# Patient Record
Sex: Female | Born: 1995 | Race: White | Hispanic: No | Marital: Single | Smoking: Never smoker
Health system: Southern US, Community
[De-identification: ages and names within clinical notes are randomized; demographics above are authoritative.]

## PROBLEM LIST (undated history)

## (undated) DIAGNOSIS — I10 Essential (primary) hypertension: Secondary | ICD-10-CM

## (undated) DIAGNOSIS — J45909 Unspecified asthma, uncomplicated: Secondary | ICD-10-CM

## (undated) DIAGNOSIS — J324 Chronic pansinusitis: Secondary | ICD-10-CM

## (undated) DIAGNOSIS — D6800 Von Willebrand disease, unspecified: Secondary | ICD-10-CM

## (undated) HISTORY — PX: WISDOM TOOTH EXTRACTION: SHX21

---

## 2015-09-24 NOTE — Progress Notes (Signed)
No show

## 2015-09-26 ENCOUNTER — Ambulatory Visit (HOSPITAL_COMMUNITY): Payer: Self-pay | Admitting: Oncology

## 2015-10-02 NOTE — Progress Notes (Signed)
NO SHOW

## 2015-10-03 ENCOUNTER — Ambulatory Visit (HOSPITAL_COMMUNITY): Payer: Self-pay | Admitting: Oncology

## 2015-10-03 ENCOUNTER — Encounter (HOSPITAL_COMMUNITY): Payer: Self-pay | Admitting: Oncology

## 2015-11-30 ENCOUNTER — Other Ambulatory Visit (INDEPENDENT_AMBULATORY_CARE_PROVIDER_SITE_OTHER): Payer: Self-pay | Admitting: Otolaryngology

## 2015-11-30 ENCOUNTER — Ambulatory Visit (INDEPENDENT_AMBULATORY_CARE_PROVIDER_SITE_OTHER): Payer: Medicaid Other | Admitting: Otolaryngology

## 2015-11-30 DIAGNOSIS — J310 Chronic rhinitis: Secondary | ICD-10-CM

## 2015-11-30 DIAGNOSIS — J0101 Acute recurrent maxillary sinusitis: Secondary | ICD-10-CM

## 2015-11-30 DIAGNOSIS — J330 Polyp of nasal cavity: Secondary | ICD-10-CM | POA: Diagnosis not present

## 2015-11-30 DIAGNOSIS — J329 Chronic sinusitis, unspecified: Secondary | ICD-10-CM

## 2015-12-06 ENCOUNTER — Ambulatory Visit (HOSPITAL_COMMUNITY)
Admission: RE | Admit: 2015-12-06 | Discharge: 2015-12-06 | Disposition: A | Discharge status: Home / Self Care | Payer: Medicaid Other | Source: Ambulatory Visit | Attending: Otolaryngology | Admitting: Otolaryngology

## 2015-12-06 DIAGNOSIS — J329 Chronic sinusitis, unspecified: Secondary | ICD-10-CM | POA: Diagnosis not present

## 2015-12-14 ENCOUNTER — Ambulatory Visit (INDEPENDENT_AMBULATORY_CARE_PROVIDER_SITE_OTHER): Payer: Medicaid Other | Admitting: Otolaryngology

## 2015-12-14 DIAGNOSIS — J310 Chronic rhinitis: Secondary | ICD-10-CM

## 2015-12-14 DIAGNOSIS — J320 Chronic maxillary sinusitis: Secondary | ICD-10-CM

## 2016-02-15 ENCOUNTER — Ambulatory Visit (INDEPENDENT_AMBULATORY_CARE_PROVIDER_SITE_OTHER): Payer: Medicaid Other | Admitting: Otolaryngology

## 2016-04-18 ENCOUNTER — Ambulatory Visit (INDEPENDENT_AMBULATORY_CARE_PROVIDER_SITE_OTHER): Payer: Medicaid Other | Admitting: Otolaryngology

## 2016-07-29 ENCOUNTER — Encounter (HOSPITAL_COMMUNITY): Payer: Self-pay | Admitting: Emergency Medicine

## 2016-07-29 ENCOUNTER — Emergency Department (HOSPITAL_COMMUNITY): Payer: Medicaid Other

## 2016-07-29 ENCOUNTER — Emergency Department (HOSPITAL_COMMUNITY)
Admission: EM | Admit: 2016-07-29 | Discharge: 2016-07-29 | Disposition: A | Discharge status: Home / Self Care | Payer: Medicaid Other | Attending: Emergency Medicine | Admitting: Emergency Medicine

## 2016-07-29 DIAGNOSIS — J209 Acute bronchitis, unspecified: Secondary | ICD-10-CM

## 2016-07-29 DIAGNOSIS — R05 Cough: Secondary | ICD-10-CM | POA: Diagnosis present

## 2016-07-29 HISTORY — DX: Von Willebrand disease, unspecified: D68.00

## 2016-07-29 HISTORY — DX: Chronic pansinusitis: J32.4

## 2016-07-29 HISTORY — DX: Von Willebrand's disease: D68.0

## 2016-07-29 MED ORDER — ALBUTEROL SULFATE HFA 108 (90 BASE) MCG/ACT IN AERS: 1.0000 | INHALATION_SPRAY | Freq: Four times a day (QID) | RESPIRATORY_TRACT | 0 refills | Status: DC | PRN

## 2016-07-29 MED ORDER — FLUCONAZOLE 150 MG PO TABS: ORAL_TABLET | ORAL | 0 refills | Status: DC

## 2016-07-29 MED ORDER — AZITHROMYCIN 250 MG PO TABS: 250.0000 mg | ORAL_TABLET | Freq: Every day | ORAL | 0 refills | Status: DC

## 2016-07-29 NOTE — ED Triage Notes (Signed)
Pt reports cough that started 3-4 days ago. Pt states it is intermittently productive with clear sputum, no fever.

## 2016-07-29 NOTE — ED Provider Notes (Signed)
[facility]PT Provider Note   CSN: [id number] Arrival date & time: 07/29/16  1114  By signing my name below, I, Margaret Juarez, attest that this documentation has been prepared under the direction and in the presence of non-physician practitioner, Ok Edwards, PA-C. Electronically Signed: Majel Juarez, Scribe. 07/29/2016. 12:18 PM.  History   Chief Complaint Chief Complaint  Patient presents with  . Cough   The history is provided by the patient. No language interpreter was used.   HPI Comments: Margaret Juarez is a 20 y.o. female with PMHx of chronic pansinusitis, who presents to the Emergency Department complaining of gradually worsening, cough and shortness of breath that began 3-4 days ago. Pt reports her cough is intermittently productive with clear sputum. She notes associated chest pain described as tightness that is exacerbated when taking a deep breath. She reports hx of asthma as a young child but states she has not used an inhaler since she was ~20 years old.. She denies headache, sinus pressure, fever and hx of smoking; though, her boyfriend smokes often around her.   Past Medical History:  Diagnosis Date  . Chronic pansinusitis   . Von Willebrand disease (HCC)    There are no active problems to display for this patient.  Past Surgical History:  Procedure Laterality Date  . WISDOM TOOTH EXTRACTION      OB History    No data available     Home Medications    Prior to Admission medications   Medication Sig Start Date End Date Taking? Authorizing Provider  ferrous sulfate 325 (65 FE) MG tablet Take 325 mg by mouth daily with breakfast.   Yes Historical Provider, MD  nystatin cream (MYCOSTATIN) Apply 1 application topically 2 (two) times daily.   Yes Historical Provider, MD    Family History Family History  Problem Relation Age of Onset  . Osteoarthritis Mother   . Osteoarthritis Father   . Cancer Other   . Osteoarthritis Other     Social  History Social History  Substance Use Topics  . Smoking status: Never Smoker  . Smokeless tobacco: Never Used  . Alcohol use No     Allergies   Cephalexin   Review of Systems Review of Systems  Constitutional: Negative for fever.  HENT: Negative for sinus pressure.   Respiratory: Positive for cough, chest tightness and shortness of breath.   Cardiovascular: Positive for chest pain.  Neurological: Negative for headaches.  All other systems reviewed and are negative.  Physical Exam Updated Vital Signs BP 136/77 (BP Location: Left Arm)   Pulse 120   Temp 97.9 F (36.6 C) (Oral)   Resp 16   Ht 5\' 9"  (1.753 m)   Wt 152 lb (68.9 kg)   SpO2 100%   BMI 22.45 kg/m   Physical Exam  Constitutional: She is oriented to person, place, and time. She appears well-developed and well-nourished.  HENT:  Head: Normocephalic.  Eyes: EOM are normal.  Neck: Normal range of motion.  Pulmonary/Chest:  Harsh breath sounds  Abdominal: She exhibits no distension.  Musculoskeletal: Normal range of motion.  Neurological: She is alert and oriented to person, place, and time.  Psychiatric: She has a normal mood and affect.  Nursing note and vitals reviewed.  ED Treatments / Results  Labs (all labs ordered are listed, but only abnormal results are displayed) Labs Reviewed - No data to display  EKG  EKG Interpretation None       Radiology Dg Chest 2  View  Result Date: 07/29/2016 CLINICAL DATA:  Cough, shortness of breath EXAM: CHEST  2 VIEW COMPARISON:  None. FINDINGS: Lungs are clear.  No pleural effusion or pneumothorax. The heart is normal in size. Visualized osseous structures are within normal limits. IMPRESSION: Normal chest radiographs. Electronically Signed   By: Charline BillsSriyesh  Krishnan M.D.   On: 07/29/2016 11:53   Procedures Procedures (including critical care time)  Medications Ordered in ED Medications - No data to display  DIAGNOSTIC STUDIES:  Oxygen Saturation is 100%  on RA, normal by my interpretation.    COORDINATION OF CARE:  12:17 PM Discussed treatment plan with pt at bedside and pt agreed to plan.  Initial Impression / Assessment and Plan / ED Course  I have reviewed the triage vital signs and the nursing notes.  Pertinent labs & imaging results that were available during my care of the patient were reviewed by me and considered in my medical decision making (see chart for details).  Clinical Course      I personally performed the services described in this documentation, which was scribed in my presence. The recorded information has been reviewed and is accurate.   Final Clinical Impressions(s) / ED Diagnoses   Final diagnoses:  Acute bronchitis, unspecified organism    New Prescriptions Discharge Medication List as of 07/29/2016 12:20 PM    START taking these medications   Details  albuterol (PROVENTIL HFA;VENTOLIN HFA) 108 (90 Base) MCG/ACT inhaler Inhale 1-2 puffs into the lungs every 6 (six) hours as needed for wheezing or shortness of breath., Starting Mon 07/29/2016, Print    azithromycin (ZITHROMAX) 250 MG tablet Take 1 tablet (250 mg total) by mouth daily. Take first 2 tablets together, then 1 every day until finished., Starting Mon 07/29/2016, Print    fluconazole (DIFLUCAN) 150 MG tablet One tablet at sign of yeast, Print        I personally performed the services in this documentation, which was scribed in my presence.  The recorded information has been reviewed and considered.   Barnet PallKaren SofiaPAC.   Elson AreasLeslie K Jamario Colina, PA-C 07/29/16 1433    Lonia SkinnerLeslie K HemlockSofia, PA-C 07/29/16 1433    Azalia BilisKevin Campos, MD 07/29/16 [phone]

## 2016-10-14 DIAGNOSIS — E0500 Thyrotoxicosis with diffuse goiter without thyrotoxic crisis or storm: Secondary | ICD-10-CM

## 2016-10-14 HISTORY — DX: Thyrotoxicosis with diffuse goiter without thyrotoxic crisis or storm: E05.00

## 2016-10-21 ENCOUNTER — Ambulatory Visit (INDEPENDENT_AMBULATORY_CARE_PROVIDER_SITE_OTHER): Payer: Medicaid Other | Admitting: Cardiology

## 2016-10-21 ENCOUNTER — Ambulatory Visit: Payer: Medicaid Other

## 2016-10-21 ENCOUNTER — Encounter: Payer: Self-pay | Admitting: Cardiology

## 2016-10-21 VITALS — BP 133/93 | HR 94 | Ht 69.0 in | Wt 155.6 lb

## 2016-10-21 DIAGNOSIS — R000 Tachycardia, unspecified: Secondary | ICD-10-CM

## 2016-10-21 DIAGNOSIS — E0590 Thyrotoxicosis, unspecified without thyrotoxic crisis or storm: Secondary | ICD-10-CM | POA: Diagnosis not present

## 2016-10-21 NOTE — Progress Notes (Signed)
Clinical Summary Ms. Daphine DeutscherMartin is a 21 y.o.female seen as a new patient today, she is referred by NP Dixon  1. Tachycardia - recent ER visit with chills, lightheadedness, HAs, and sinus pressure. Found to have significant tachcyardia in the 130s. EKGs are not available at this time.  - reports 2 prior episodes during ER visits with elevated heart rates.  - she denies any specific palpitaitons, but can have gernal feelings of feeling hot, lightheaded, and dizzy at times. Symptoms often brought on by standing.  - no coffee. Previously drank 1-2 glasses of tea daily but has stopped, no energy drinks. Just occasional EtoH.  - bottles of water x 4 per day.      Past Medical History:  Diagnosis Date  . Chronic pansinusitis   . Von Willebrand disease (HCC)      Allergies  Allergen Reactions  . Cephalexin Rash     Current Outpatient Prescriptions  Medication Sig Dispense Refill  . albuterol (PROVENTIL HFA;VENTOLIN HFA) 108 (90 Base) MCG/ACT inhaler Inhale 1-2 puffs into the lungs every 6 (six) hours as needed for wheezing or shortness of breath. 1 Inhaler 0  . azithromycin (ZITHROMAX) 250 MG tablet Take 1 tablet (250 mg total) by mouth daily. Take first 2 tablets together, then 1 every day until finished. 6 tablet 0  . ferrous sulfate 325 (65 FE) MG tablet Take 325 mg by mouth daily with breakfast.    . fluconazole (DIFLUCAN) 150 MG tablet One tablet at sign of yeast 1 tablet 0  . nystatin cream (MYCOSTATIN) Apply 1 application topically 2 (two) times daily.     No current facility-administered medications for this visit.      Past Surgical History:  Procedure Laterality Date  . WISDOM TOOTH EXTRACTION       Allergies  Allergen Reactions  . Cephalexin Rash      Family History  Problem Relation Age of Onset  . Osteoarthritis Mother   . Osteoarthritis Father   . Cancer Other   . Osteoarthritis Other      Social History Ms. Daphine DeutscherMartin reports that she has never  smoked. She has never used smokeless tobacco. Ms. Daphine DeutscherMartin reports that she does not drink alcohol.   Review of Systems CONSTITUTIONAL: No weight loss, fever, chills, weakness or fatigue.  HEENT: Eyes: No visual loss, blurred vision, double vision or yellow sclerae.No hearing loss, sneezing, congestion, runny nose or sore throat.  SKIN: No rash or itching.  CARDIOVASCULAR: per HPI RESPIRATORY: No shortness of breath, cough or sputum.  GASTROINTESTINAL: No anorexia, nausea, vomiting or diarrhea. No abdominal pain or blood.  GENITOURINARY: No burning on urination, no polyuria NEUROLOGICAL: occasional dizziness MUSCULOSKELETAL: No muscle, back pain, joint pain or stiffness.  LYMPHATICS: No enlarged nodes. No history of splenectomy.  PSYCHIATRIC: No history of depression or anxiety.  ENDOCRINOLOGIC: No reports of sweating, cold or heat intolerance. No polyuria or polydipsia.  Marland Kitchen.   Physical Examination Vitals:   10/21/16 1442  BP: (!) 133/93  Pulse: 94   Vitals:   10/21/16 1442  Weight: 155 lb 9.6 oz (70.6 kg)  Height: 5\' 9"  (1.753 m)    Gen: resting comfortably, no acute distress HEENT: no scleral icterus, pupils equal round and reactive, no palptable cervical adenopathy,  CV: RRR, no m/r/g, no jvd Resp: Clear to auscultation bilaterally GI: abdomen is soft, non-tender, non-distended, normal bowel sounds, no hepatosplenomegaly MSK: extremities are warm, no edema.  Skin: warm, no rash Neuro:  no focal deficits  Psych: appropriate affect    Assessment and Plan  1. Tachycardia - recent ER visit with multiple systemic symptoms, found to be tachcyardia rate 130s. Appears no EKGs were done at the time - EKG in clinic shows SR at 90 - she is significantly orthostatic in clinic by heart rate. HR increased from 94 lying to 134 standing - low TSH during ER visit, we will obtain free T4 and T3 as well as cortisol levels. Pending results consider referal to endocrine - obtain 24 hr  holter monitor. Pending results consider beta blocker.  - encouraged increased oral hydration and salt intake.    2. Hyperthyroidism - very low TSH during recent ER visit. Her symptoms may be related to hyperthyroidism - obtain additional thyroid studies, pending results consider referral to endocrinology. She has no pcp     Antoine Poche, M.D.,

## 2016-10-21 NOTE — Patient Instructions (Addendum)
Medication Instructions:  Continue all current medications.  Labwork: TSH, free T4, T3, Cortisol - orders given today.  Testing/Procedures: Your physician has recommended that you wear a 24 hour holter monitor. Holter monitors are medical devices that record the heart's electrical activity. Doctors most often use these monitors to diagnose arrhythmias. Arrhythmias are problems with the speed or rhythm of the heartbeat. The monitor is a small, portable device. You can wear one while you do your normal daily activities. This is usually used to diagnose what is causing palpitations/syncope (passing out).  Follow-Up:  Office will contact with results via phone or letter.    3 weeks   Any Other Special Instructions Will Be Listed Below (If Applicable).  If you need a refill on your cardiac medications before your next appointment, please call your pharmacy.

## 2016-10-24 ENCOUNTER — Encounter: Payer: Self-pay | Admitting: *Deleted

## 2016-10-25 ENCOUNTER — Other Ambulatory Visit: Payer: Self-pay | Admitting: *Deleted

## 2016-10-25 ENCOUNTER — Telehealth: Payer: Self-pay | Admitting: *Deleted

## 2016-10-25 DIAGNOSIS — E0590 Thyrotoxicosis, unspecified without thyrotoxic crisis or storm: Secondary | ICD-10-CM

## 2016-10-25 MED ORDER — METOPROLOL TARTRATE 25 MG PO TABS: 12.5000 mg | ORAL_TABLET | Freq: Two times a day (BID) | ORAL | 3 refills | Status: DC

## 2016-10-25 NOTE — Telephone Encounter (Signed)
-----   Message from Antoine PocheJonathan F Branch, MD sent at 10/23/2016  4:26 PM EST ----- She is hyperthyroid, [place]. Please start lopressor 12.5mg  bid to help with the symptoms of fast heart rates. She was to have a T3 and cortisol as well, are those back?   Dina RichJonathan Branch MD

## 2016-10-25 NOTE — Telephone Encounter (Signed)
Pt aware and says she never got T3 and cortisol lab orders - [facility] and she will have drawn - [facility] - routed results pcp

## 2016-11-05 ENCOUNTER — Encounter: Payer: Self-pay | Admitting: *Deleted

## 2016-11-05 ENCOUNTER — Telehealth: Payer: Self-pay | Admitting: Cardiology

## 2016-11-05 NOTE — Telephone Encounter (Signed)
Patient notified via voice mail that no new labs have been received to date.  [facility] this morning requesting those results.  Will notify her as soon as we receive & Dr. Wyline MoodBranch reviews.  Also, reminded patient to return monitor as we need to get uploaded prior to her OV on 11/11/2016.

## 2016-11-05 NOTE — Telephone Encounter (Signed)
Patient called stating that she needs to know if she is due any more lab work before next appointment.

## 2016-11-11 ENCOUNTER — Telehealth: Payer: Self-pay | Admitting: *Deleted

## 2016-11-11 ENCOUNTER — Ambulatory Visit: Payer: Medicaid Other | Admitting: Cardiology

## 2016-11-11 NOTE — Telephone Encounter (Signed)
Left multiple messages for pt to return call - schedulers were working on referral to endocrinologist - pt was NS for 1/29 OV

## 2016-11-11 NOTE — Telephone Encounter (Signed)
-----   Message from Antoine PocheJonathan F Branch, MD sent at 11/05/2016  1:10 PM EST ----- Elevated free T3 level, normal cortisol. Please forward to her endocrinologist to help interpret total findings  Dominga FerryJ Branch MD

## 2016-11-11 NOTE — Telephone Encounter (Signed)
Have left multiple messages for pt - pt has not returned holter monitor and was NS for 1/29 appt

## 2016-11-11 NOTE — Telephone Encounter (Signed)
-----   Message from Megan SalonVicky T Slaughter sent at 11/06/2016 12:56 PM EST ----- Ok. Left message for patient with information. So if she calls back I will send to you. I need to remind her to take Her Insurance card with her.  ----- Message ----- From: Albertine PatriciaStaci T Aunica Dauphinee, CMA Sent: 11/06/2016  11:54 AM To: Megan SalonVicky T Slaughter  Ok thank you let me know   Geanie BerlinStaci ----- Message ----- From: Megan SalonVicky T Slaughter Sent: 11/06/2016  11:40 AM To: Albertine PatriciaStaci T Ladarrian Asencio, CMA  I am going to have to make a telephone call to this office.  ----- Message ----- From: Albertine PatriciaStaci T Mihran Lebarron, CMA Sent: 11/06/2016  11:11 AM To: Megan SalonVicky T Slaughter  Ok thanks so will you send referral to pt pcp? ----- Message ----- From: Megan SalonVicky T Slaughter Sent: 11/06/2016  11:00 AM To: Albertine PatriciaStaci T Makaveli Hoard, CMA  Spoke with Dr. Laurena SlimmerPreston Clark's office.  Patient is [place] Medicaid so therefore the PCP is going to have to do the referral for insurance And then we can send notes.     ----- Message ----- From: Albertine PatriciaStaci T [place] Lasseter, CMA Sent: 11/06/2016   8:59 AM To: Megan SalonVicky T Slaughter  Do you know if she has appt with endocrinologist yet? She was referred by us.  Jameson Morrow

## 2016-11-12 ENCOUNTER — Other Ambulatory Visit: Payer: Self-pay | Admitting: Internal Medicine

## 2016-11-12 DIAGNOSIS — E0500 Thyrotoxicosis with diffuse goiter without thyrotoxic crisis or storm: Secondary | ICD-10-CM

## 2016-11-12 DIAGNOSIS — E039 Hypothyroidism, unspecified: Secondary | ICD-10-CM

## 2016-11-18 ENCOUNTER — Encounter (HOSPITAL_COMMUNITY)
Admission: RE | Admit: 2016-11-18 | Discharge: 2016-11-18 | Disposition: A | Discharge status: Home / Self Care | Payer: Medicaid Other | Source: Ambulatory Visit | Attending: Internal Medicine | Admitting: Internal Medicine

## 2016-11-18 ENCOUNTER — Encounter (HOSPITAL_COMMUNITY): Payer: Self-pay

## 2016-11-18 DIAGNOSIS — E0500 Thyrotoxicosis with diffuse goiter without thyrotoxic crisis or storm: Secondary | ICD-10-CM | POA: Insufficient documentation

## 2016-11-18 DIAGNOSIS — E039 Hypothyroidism, unspecified: Secondary | ICD-10-CM | POA: Insufficient documentation

## 2016-11-18 HISTORY — DX: Essential (primary) hypertension: I10

## 2016-11-18 HISTORY — DX: Unspecified asthma, uncomplicated: J45.909

## 2016-11-18 MED ORDER — SODIUM IODIDE I-123 7.4 MBQ CAPS
400.0000 | ORAL_CAPSULE | Freq: Once | ORAL | Status: AC
  Administered 2016-11-18: 380 via ORAL

## 2016-11-19 ENCOUNTER — Encounter (HOSPITAL_COMMUNITY): Payer: Self-pay

## 2016-11-19 ENCOUNTER — Encounter (HOSPITAL_COMMUNITY)
Admission: RE | Admit: 2016-11-19 | Discharge: 2016-11-19 | Disposition: A | Discharge status: Home / Self Care | Payer: Medicaid Other | Source: Ambulatory Visit | Attending: Internal Medicine | Admitting: Internal Medicine

## 2016-11-19 DIAGNOSIS — E039 Hypothyroidism, unspecified: Secondary | ICD-10-CM | POA: Diagnosis not present

## 2016-11-19 DIAGNOSIS — E0500 Thyrotoxicosis with diffuse goiter without thyrotoxic crisis or storm: Secondary | ICD-10-CM | POA: Diagnosis not present

## 2016-12-16 ENCOUNTER — Other Ambulatory Visit: Payer: Self-pay | Admitting: *Deleted

## 2016-12-16 ENCOUNTER — Encounter (INDEPENDENT_AMBULATORY_CARE_PROVIDER_SITE_OTHER): Payer: Medicaid Other

## 2016-12-16 DIAGNOSIS — R000 Tachycardia, unspecified: Secondary | ICD-10-CM

## 2017-01-01 ENCOUNTER — Telehealth: Payer: Self-pay | Admitting: *Deleted

## 2017-01-01 ENCOUNTER — Encounter: Payer: Self-pay | Admitting: *Deleted

## 2017-01-01 NOTE — Telephone Encounter (Signed)
-----   Message from Antoine PocheJonathan F Branch, MD sent at 12/24/2016 11:46 AM EDT ----- Heart monitor shows fast heart rates at times but no abnormal rhythms. Has she been to see the endocrionologist about her thyroid? How are her symptoms doing?  Margaret FerryJ Branch MD

## 2017-01-01 NOTE — Telephone Encounter (Signed)
Left multiple message for pt - will mail pt letter

## 2017-01-03 NOTE — Addendum Note (Signed)
Addended by: Burman NievesASHWORTH, Massiel Stipp T on: 01/03/2017 02:15 PM   Modules accepted: Orders

## 2017-01-03 NOTE — Telephone Encounter (Signed)
Glad she was able to see endo and start treatment. Endo can manage her Graves disease as well has her beta blocker (propanolol), she may f/u with us as needed   Dominga FerryJ Browning Southwood MD

## 2017-01-03 NOTE — Telephone Encounter (Signed)
Pt says she was dx with Graves disease by Dr Chestine Sporelark endocrinologist - started her on methenolone 10 mg and stopped metoprolol and started propanolol every 6 hours as needed - says she missed January f/u she forgot with everything going on.

## 2017-12-08 ENCOUNTER — Ambulatory Visit: Payer: Self-pay | Admitting: "Endocrinology

## 2018-02-16 ENCOUNTER — Ambulatory Visit: Payer: Self-pay | Admitting: "Endocrinology

## 2018-03-19 ENCOUNTER — Ambulatory Visit (INDEPENDENT_AMBULATORY_CARE_PROVIDER_SITE_OTHER): Payer: 59 | Admitting: "Endocrinology

## 2018-03-19 ENCOUNTER — Encounter: Payer: Self-pay | Admitting: "Endocrinology

## 2018-03-19 VITALS — BP 123/81 | HR 82 | Ht 69.0 in | Wt 161.0 lb

## 2018-03-19 DIAGNOSIS — E0500 Thyrotoxicosis with diffuse goiter without thyrotoxic crisis or storm: Secondary | ICD-10-CM

## 2018-03-19 DIAGNOSIS — E0590 Thyrotoxicosis, unspecified without thyrotoxic crisis or storm: Secondary | ICD-10-CM | POA: Insufficient documentation

## 2018-03-19 DIAGNOSIS — Z3A25 25 weeks gestation of pregnancy: Secondary | ICD-10-CM | POA: Diagnosis not present

## 2018-03-19 LAB — COMPLETE METABOLIC PANEL WITH GFR
AG RATIO: 1.3 (calc) (ref 1.0–2.5)
ALBUMIN MSPROF: 4 g/dL (ref 3.6–5.1)
ALKALINE PHOSPHATASE (APISO): 68 U/L (ref 33–115)
ALT: 9 U/L (ref 6–29)
AST: 10 U/L (ref 10–30)
BILIRUBIN TOTAL: 0.2 mg/dL (ref 0.2–1.2)
BUN / CREAT RATIO: 20 (calc) (ref 6–22)
BUN: 9 mg/dL (ref 7–25)
CHLORIDE: 104 mmol/L (ref 98–110)
CO2: 24 mmol/L (ref 20–32)
Calcium: 8.8 mg/dL (ref 8.6–10.2)
Creat: 0.46 mg/dL — ABNORMAL LOW (ref 0.50–1.10)
GFR, Est African American: 165 mL/min/{1.73_m2} (ref >=60)
GFR, Est Non African American: 142 mL/min/{1.73_m2} (ref >=60)
GLOBULIN: 3.1 g/dL (ref 1.9–3.7)
Glucose, Bld: 92 mg/dL (ref 65–99)
POTASSIUM: 4 mmol/L (ref 3.5–5.3)
SODIUM: 137 mmol/L (ref 135–146)
Total Protein: 7.1 g/dL (ref 6.1–8.1)

## 2018-03-19 LAB — HEPATIC FUNCTION PANEL
AG Ratio: 1.3 (calc) (ref 1.0–2.5)
ALKALINE PHOSPHATASE (APISO): 68 U/L (ref 33–115)
ALT: 9 U/L (ref 6–29)
AST: 10 U/L (ref 10–30)
Albumin: 4 g/dL (ref 3.6–5.1)
BILIRUBIN DIRECT: 0 mg/dL (ref 0.0–0.2)
BILIRUBIN INDIRECT: 0.2 mg/dL (ref 0.2–1.2)
Globulin: 3.1 g/dL (calc) (ref 1.9–3.7)
TOTAL PROTEIN: 7.1 g/dL (ref 6.1–8.1)
Total Bilirubin: 0.2 mg/dL (ref 0.2–1.2)

## 2018-03-19 LAB — TSH

## 2018-03-19 LAB — T3, FREE: T3, Free: 3 pg/mL (ref 2.3–4.2)

## 2018-03-19 LAB — T4, FREE: Free T4: 1.2 ng/dL (ref 0.8–1.8)

## 2018-03-19 NOTE — Progress Notes (Signed)
Endocrinology Consult Note                                            03/19/2018, 5:46 PM   Subjective:    Patient ID: Margaret Juarez, female    DOB: [birth year], PCP Margaret Crane, NP   Past Medical History:  Diagnosis Date  . Asthma   . Chronic pansinusitis   . Hypertension   . Von Willebrand disease (HCC)    Past Surgical History:  Procedure Laterality Date  . WISDOM TOOTH EXTRACTION     Social History   Socioeconomic History  . Marital status: Single    Spouse name: Not on file  . Number of children: Not on file  . Years of education: Not on file  . Highest education level: Not on file  Occupational History  . Not on file  Social Needs  . Financial resource strain: Not on file  . Food insecurity:    Worry: Not on file    Inability: Not on file  . Transportation needs:    Medical: Not on file    Non-medical: Not on file  Tobacco Use  . Smoking status: Never Smoker  . Smokeless tobacco: Never Used  Substance and Sexual Activity  . Alcohol use: No  . Drug use: No  . Sexual activity: Yes    Birth control/protection: Injection  Lifestyle  . Physical activity:    Days per week: Not on file    Minutes per session: Not on file  . Stress: Not on file  Relationships  . Social connections:    Talks on phone: Not on file    Gets together: Not on file    Attends religious service: Not on file    Active member of club or organization: Not on file    Attends meetings of clubs or organizations: Not on file    Relationship status: Not on file  Other Topics Concern  . Not on file  Social History Narrative  . Not on file   Outpatient Encounter Medications as of 03/19/2018  Medication Sig  . Prenat w/o A-FeCbGl-DSS-FA-DHA (CITRANATAL 90 DHA PO) Take by mouth daily.  . Doxylamine-Pyridoxine (DICLEGIS) 10-10 MG TBEC Take by mouth 2 (two) times daily.  . methimazole (TAPAZOLE) 10 MG tablet Take 10 mg by mouth daily.  . metoprolol tartrate (LOPRESSOR) 25 MG  tablet Take 25 mg by mouth 2 (two) times daily.  . propranolol (INDERAL) 20 MG tablet Take 20 mg by mouth every 6 (six) hours.   . [DISCONTINUED] albuterol (PROVENTIL HFA;VENTOLIN HFA) 108 (90 Base) MCG/ACT inhaler Inhale 1-2 puffs into the lungs every 6 (six) hours as needed for wheezing or shortness of breath.  . [DISCONTINUED] amoxicillin-clavulanate (AUGMENTIN) 875-125 MG tablet Take 1 tablet by mouth 2 (two) times daily.  . [DISCONTINUED] hydrOXYzine (VISTARIL) 25 MG capsule Take 25 mg by mouth at bedtime as needed.  . [DISCONTINUED] medroxyPROGESTERone (DEPO-PROVERA) 150 MG/ML injection Inject 150 mg into the muscle every 3 (three) months.   No facility-administered encounter medications on file as of 03/19/2018.    ALLERGIES: Allergies  Allergen Reactions  . Mango Butter   . Cephalexin Rash    VACCINATION STATUS:  There is no immunization history on file for this patient.  HPI Margaret Juarez is 22 y.o. female who presents today with a medical history  as above. she is being seen in consultation for hyperthyroidism during second trimester pregnancy requested by Margaret Crane, NP. -She was diagnosed with hyperthyroidism in October 2018 for which she was initiated on what appears to be antithyroid medications.  After taking this medication for 6 to 8 weeks time, did not tolerate and discontinued by herself. -Her prior endocrinologist closed practice and could not get back for evaluation. -She underwent thyroid function test in January 2019 which was significant for hyperthyroidism which was confirmed by thyroid uptake and scan on November 18, 2016 with 24-hour radioiodine uptake of 71% consistent with Graves' disease.  At that time she was [redacted] weeks pregnant.   -She was supposed to come to this clinic for consult, for various reasons she failed to show up.   -She currently at gestational age of [redacted] weeks.  She has lost 6 pounds between November 12, 2017 and today March 19, 2018.  She claims to  have been closely following with her OB/GYN provider. -No subsequent thyroid function tests were available to review. -For unclear reasons, she has discontinued her beta-blockers as well.  She denies palpitations, heat intolerance.  However she reports tremors.    -She denies any family history of thyroid dysfunction.  She denies any family history of thyroid cancer.  Review of Systems  Constitutional: + lost 6 pounds overall since January 2019, + fatigue, no subjective hyperthermia, no subjective hypothermia Eyes: no blurry vision, no xerophthalmia ENT: no sore throat, no nodules palpated in throat, no dysphagia/odynophagia, no hoarseness Cardiovascular: no Chest Pain, no Shortness of Breath, no palpitations, no leg swelling Respiratory: no cough, no SOB Gastrointestinal: no Nausea/Vomiting/Diarhhea Musculoskeletal: no muscle/joint aches Skin: no rashes Neurological: + tremors, no numbness, no tingling, no dizziness Psychiatric: no depression, no anxiety  Objective:    BP 123/81   Pulse 82   Ht 5\' 9"  (1.753 m)   Wt 161 lb (73 kg)   BMI 23.78 kg/m   Wt Readings from Last 3 Encounters:  03/19/18 161 lb (73 kg)  10/21/16 155 lb 9.6 oz (70.6 kg)  07/29/16 152 lb (68.9 kg) (82 %, Z= 0.91)*   * Growth percentiles are based on CDC (Girls, 2-20 Years) data.    Physical Exam  Constitutional: + Gravid uterus, build with BMI of 23,  Not in acute distress, normal state of mind Eyes: PERRLA, EOMI, no exophthalmos ENT: moist mucous membranes, + palpable thyromegaly, no cervical lymphadenopathy Cardiovascular: normal precordial activity, Regular Rate and Rhythm, no Murmur/Rubs/Gallops Respiratory:  adequate breathing efforts, no gross chest deformity, Clear to auscultation bilaterally Gastrointestinal: + gravid uterus, abdomen is soft, non -tender, No distension, Bowel Sounds present Musculoskeletal: no gross deformities, strength intact in all four extremities Skin: moist, warm, no  rashes Neurological: + tremor with outstretched hands, Deep tendon reflexes normal in all four extremities.  November 18, 2016 thyroid uptake and scan: 24-hour radioiodine uptake at 71% consistent with Graves' disease, no definite areas of increased or decreased tracer localization Thyroid function test on October 22, 2016 TSH < 0.01, free T4 4.22 Thyroid function test on November 12, 2017 TSH <0.006, free T4 3.51   Assessment & Plan:   1. Hyperthyroidism 2. Graves' disease 3. [redacted] weeks gestation of pregnancy - Margaret Juarez  is being seen at a kind request of Margaret Crane, NP. - I have reviewed her available thyroid records and clinically evaluated the patient. - Based on reviews, she has partially treated hyperthyroidism from Graves' disease complicating second trimester pregnancy,  however, she does not have recent thyroid function tests to proceed with definitive treatment plan.   -She clearly has compliance problems, at risk of complicated pregnancy. -Will be sent to lab for stat thyroid function test as well as hepatic function profile. - She is remarkably stable for the given degree of thyroid activity at 71% uptake. -If she has significant thyroid hormone burden or any indication of organ failure to indicate thyroid storm, she will be sent for inpatient treatment. -In rare instance of difficult to control thyroid function, she will be considered for thyroidectomy. -If her thyroid hormone profile is manageable for outpatient treatment, she will be considered for methimazole therapy as soon as possible.  Second trimester TSH target of  0.55-2.73 .  -She will be called after her labs are reviewed. -In the long-term, she would benefit from ablative therapy with I-131 treatment.  She does not plan for breast-feeding, will be approached for same after delivery. -Her physical exam is stable and her pulse rate is 82.  She is off of all beta-blockers. - I did not initiate any new  prescriptions today. - I advised her  to maintain close follow up with Margaret Craneixon, Kelly, NP for primary care needs.   - Time spent with the patient: 45 minutes, of which >50% was spent in obtaining information about her symptoms, reviewing her previous labs, evaluations, and treatments, counseling her about her hypothyroidism from Graves' disease, and developing a plan to confirm the diagnosis and long term treatment as necessary.  Margaret CharonBayleigh N Juarez participated in the discussions, expressed understanding, and voiced agreement with the above plans.  All questions were answered to her satisfaction. she is encouraged to contact clinic should she have any questions or concerns prior to her return visit.  Follow up plan: Return in about 1 day (around 03/20/2018), or she goes to lab for STAT labs, will be called after results. Marquis Lunch.  Gebre Casyn Becvar, MD [facility] [address] [place], [place] [address] Phone: [phone]  Fax: [phone]     03/19/2018, 5:46 PM  This note was partially dictated with voice recognition software. Similar sounding words can be transcribed inadequately or may not  be corrected upon review.

## 2018-03-20 ENCOUNTER — Encounter: Payer: Self-pay | Admitting: "Endocrinology

## 2018-03-20 ENCOUNTER — Ambulatory Visit (INDEPENDENT_AMBULATORY_CARE_PROVIDER_SITE_OTHER): Payer: 59 | Admitting: "Endocrinology

## 2018-03-20 VITALS — BP 111/79 | HR 79 | Ht 69.0 in | Wt 162.0 lb

## 2018-03-20 DIAGNOSIS — E0590 Thyrotoxicosis, unspecified without thyrotoxic crisis or storm: Secondary | ICD-10-CM | POA: Diagnosis not present

## 2018-03-20 DIAGNOSIS — E0500 Thyrotoxicosis with diffuse goiter without thyrotoxic crisis or storm: Secondary | ICD-10-CM

## 2018-03-20 MED ORDER — METHIMAZOLE 5 MG PO TABS: 5.0000 mg | ORAL_TABLET | Freq: Three times a day (TID) | ORAL | 3 refills | Status: DC

## 2018-03-20 NOTE — Progress Notes (Signed)
Endocrinology follow-up  Note                                            03/20/2018, 12:25 PM   Subjective:    Patient ID: Margaret Juarez, female    DOB: 1996/08/09, PCP Clarita Craneixon, Kelly, NP   Past Medical History:  Diagnosis Date  . Asthma   . Chronic pansinusitis   . Hypertension   . Von Willebrand disease (HCC)    Past Surgical History:  Procedure Laterality Date  . WISDOM TOOTH EXTRACTION     Social History   Socioeconomic History  . Marital status: Single    Spouse name: Not on file  . Number of children: Not on file  . Years of education: Not on file  . Highest education level: Not on file  Occupational History  . Not on file  Social Needs  . Financial resource strain: Not on file  . Food insecurity:    Worry: Not on file    Inability: Not on file  . Transportation needs:    Medical: Not on file    Non-medical: Not on file  Tobacco Use  . Smoking status: Never Smoker  . Smokeless tobacco: Never Used  Substance and Sexual Activity  . Alcohol use: No  . Drug use: No  . Sexual activity: Yes    Birth control/protection: Injection  Lifestyle  . Physical activity:    Days per week: Not on file    Minutes per session: Not on file  . Stress: Not on file  Relationships  . Social connections:    Talks on phone: Not on file    Gets together: Not on file    Attends religious service: Not on file    Active member of club or organization: Not on file    Attends meetings of clubs or organizations: Not on file    Relationship status: Not on file  Other Topics Concern  . Not on file  Social History Narrative  . Not on file   Outpatient Encounter Medications as of 03/20/2018  Medication Sig  . Prenat w/o A-FeCbGl-DSS-FA-DHA (CITRANATAL 90 DHA PO) Take by mouth daily.  . Doxylamine-Pyridoxine (DICLEGIS) 10-10 MG TBEC Take by mouth 2 (two) times daily.  . methimazole (TAPAZOLE) 5 MG tablet Take 1 tablet (5 mg total) by mouth 3 (three) times daily.   No  facility-administered encounter medications on file as of 03/20/2018.    ALLERGIES: Allergies  Allergen Reactions  . Mango Butter   . Cephalexin Rash    VACCINATION STATUS:  There is no immunization history on file for this patient.  HPI Margaret CharonBayleigh N Lansky is 22 y.o. female who presents today with a medical history as above. she is returning with her thyroid function tests repeated yesterday after she was seen in consultation for  hyperthyroidism/Graves' disease during second trimester pregnancy requested by Clarita Craneixon, Kelly, NP. -She was diagnosed with hyperthyroidism in October 2018 for which she was initiated on what appears to be antithyroid medications.  After taking this medication for 6 to 8 weeks time, did not tolerate and discontinued by herself. -Her prior endocrinologist closed practice and could not get back for evaluation. -She underwent thyroid function test in January 2019 which was significant for hyperthyroidism which was confirmed by thyroid uptake and scan on November 18, 2016 with 24-hour radioiodine  uptake of 71% consistent with Graves' disease.  At that time she was [redacted] weeks pregnant.   -She was supposed to come to this clinic for consult, for various reasons she failed to show up.   -She currently at gestational age of [redacted] weeks.  She has lost 6 pounds between November 12, 2017 and today March 19, 2018.  She claims to have been closely following with her OB/GYN provider. -Yesterday after her visit she was sent for stat labs which was significant only for suppressed TSH and normal free T4.   -He is not on beta-blockers. -  She denies palpitations, heat intolerance.  However she reports tremors.    -She denies any family history of thyroid dysfunction.  She denies any family history of thyroid cancer.  Review of Systems  Constitutional:  + fatigue, no subjective hyperthermia, no subjective hypothermia Eyes: no blurry vision, no xerophthalmia ENT: no sore throat, no nodules  palpated in throat, no dysphagia/odynophagia, no hoarseness Cardiovascular: no Chest Pain, no Shortness of Breath, no palpitations Respiratory: no cough, no SOB Gastrointestinal: no Nausea/Vomiting/Diarhhea Musculoskeletal: no muscle/joint aches Skin: no rashes Neurological: + tremors, no numbness, no tingling, no dizziness Psychiatric: no depression, no anxiety  Objective:    BP 111/79   Pulse 79   Ht 5\' 9"  (1.753 m)   Wt 162 lb (73.5 kg)   BMI 23.92 kg/m   Wt Readings from Last 3 Encounters:  03/20/18 162 lb (73.5 kg)  03/19/18 161 lb (73 kg)  10/21/16 155 lb 9.6 oz (70.6 kg)    Physical Exam  Constitutional: + Gravid uterus,thin build with BMI of 23 with second trimester pregnancy,  Not in acute distress, normal state of mind Eyes: PERRLA, EOMI, no exophthalmos ENT: moist mucous membranes, + palpable thyromegaly, no cervical lymphadenopathy Cardiovascular: normal precordial activity, Regular Rate and Rhythm, no Murmur/Rubs/Gallops Respiratory:  adequate breathing efforts, no gross chest deformity, Clear to auscultation bilaterally Gastrointestinal: + gravid uterus, abdomen is soft, non -tender, No distension, Bowel Sounds present Musculoskeletal: no gross deformities, strength intact in all four extremities Skin: moist, warm, no rashes Neurological: + tremor with outstretched hands, Deep tendon reflexes normal in all four extremities.   Recent Results (from the past 2160 hour(s))  TSH     Status: Abnormal   Collection Time: 03/19/18  9:20 AM  Result Value Ref Range   TSH <0.01 (L) mIU/L    Comment:           Reference Range .           > or = 20 Years  0.40-4.50 .                Pregnancy Ranges           First trimester    0.26-2.66           Second trimester   0.55-2.73           Third trimester    0.43-2.91   T4, free     Status: None   Collection Time: 03/19/18  9:20 AM  Result Value Ref Range   Free T4 1.2 0.8 - 1.8 ng/dL  T3, free     Status: None    Collection Time: 03/19/18  9:20 AM  Result Value Ref Range   T3, Free 3.0 2.3 - 4.2 pg/mL  COMPLETE METABOLIC PANEL WITH GFR     Status: Abnormal   Collection Time: 03/19/18  9:20 AM  Result Value Ref Range  Glucose, Bld 92 65 - 99 mg/dL    Comment: .            Fasting reference interval .    BUN 9 7 - 25 mg/dL   Creat 1.61 (L) 0.96 - 1.10 mg/dL   GFR, Est Non African American 142 > OR = 60 mL/min/1.76m2   GFR, Est African American 165 > OR = 60 mL/min/1.67m2   BUN/Creatinine Ratio 20 6 - 22 (calc)   Sodium 137 135 - 146 mmol/L   Potassium 4.0 3.5 - 5.3 mmol/L   Chloride 104 98 - 110 mmol/L   CO2 24 20 - 32 mmol/L   Calcium 8.8 8.6 - 10.2 mg/dL   Total Protein 7.1 6.1 - 8.1 g/dL   Albumin 4.0 3.6 - 5.1 g/dL   Globulin 3.1 1.9 - 3.7 g/dL (calc)   AG Ratio 1.3 1.0 - 2.5 (calc)   Total Bilirubin 0.2 0.2 - 1.2 mg/dL   Alkaline phosphatase (APISO) 68 33 - 115 U/L   AST 10 10 - 30 U/L   ALT 9 6 - 29 U/L  Hepatic function panel     Status: None   Collection Time: 03/19/18  9:20 AM  Result Value Ref Range   Total Protein 7.1 6.1 - 8.1 g/dL   Albumin 4.0 3.6 - 5.1 g/dL   Globulin 3.1 1.9 - 3.7 g/dL (calc)   AG Ratio 1.3 1.0 - 2.5 (calc)   Total Bilirubin 0.2 0.2 - 1.2 mg/dL   Bilirubin, Direct 0.0 0.0 - 0.2 mg/dL   Indirect Bilirubin 0.2 0.2 - 1.2 mg/dL (calc)   Alkaline phosphatase (APISO) 68 33 - 115 U/L   AST 10 10 - 30 U/L   ALT 9 6 - 29 U/L    November 18, 2016 thyroid uptake and scan: 24-hour radioiodine uptake at 71% consistent with Graves' disease, no definite areas of increased or decreased tracer localization Thyroid function test on October 22, 2016 TSH < 0.01, free T4 4.22 Thyroid function test on November 12, 2017 TSH <0.006, free T4 3.51   Assessment & Plan:   1. Hyperthyroidism 2. Graves' disease 3. [redacted] weeks gestation of pregnancy  -  she has partially treated hyperthyroidism from Graves' disease complicating second trimester pregnancy. - She is remarkably  stable for the given degree of thyroid activity at 71% uptake, and luckily she does not seem to have significant thyroid hormone burden at this time. -Her thyroid function profile is amenable for outpatient treatment-significantly suppressed TSH of <0.01.  She will benefit from conservative treatment for hyperthyroidism.  I discussed and initiated low-dose methimazole 5 mg p.o. every morning with breakfast to achieve  Second trimester TSH target of  0.55-2.73 . -She is approached to return for office visit in 4 to 5 weeks with repeat thyroid function test. -I have discussed the potential adverse effects and risks of methimazole with her.  Methimazole is the antithyroid medication of choice outside of the first trimester of pregnancy.  -In the long-term, she would benefit from ablative therapy with I-131 treatment.  She does not plan for breast-feeding, will be approached for same after delivery. -Her physical exam is stable and her pulse rate is 79-82.  She is off of all beta-blockers.  - I advised her  to maintain close follow up with Clarita Crane, NP for primary care needs.   Margaret Charon participated in the discussions, expressed understanding, and voiced agreement with the above plans.  All questions were answered to her  satisfaction. she is encouraged to contact clinic should she have any questions or concerns prior to her return visit.  Follow up plan: Return in about 5 weeks (around 04/24/2018) for follow up with pre-visit labs.  Marquis Lunch, MD [facility] [address] [place], [place] [address] Phone: [phone]  Fax: [phone]     03/20/2018, 12:25 PM  This note was partially dictated with voice recognition software. Similar sounding words can be transcribed inadequately or may not  be corrected upon review.

## 2018-04-27 ENCOUNTER — Ambulatory Visit: Payer: Medicaid Other | Admitting: "Endocrinology

## 2018-09-23 IMAGING — NM NM THYROID IMAGING W/ UPTAKE SINGLE (24 HR)
5 series · 5 of 5 positions shown · non-contrast
Comparison: None

CLINICAL DATA: Diffuse toxic goiter, tachycardia, sleep with,
elevated blood pressure, increased appetite, tremors, thyroid
enlargement

EXAM:
THYROID SCAN AND UPTAKE - 24 HOURS
TECHNIQUE: Following the per oral administration of I-W4U sodium iodide, the
patient returned at 24 hours and uptake measurements were acquired
with the uptake probe centered on the neck. Thyroid imaging was also
performed.
RADIOPHARMACEUTICALS:  380 microCuries I-W4U sodium iodide orally

[Series 2: anterior · 0.59mm/px · 1 of 1 slices shown (1 of 2)]
[im 1/1]
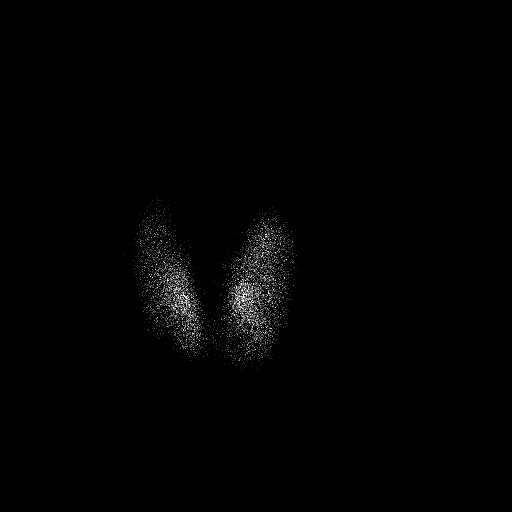

[Series 3: anterior · 1.18mm/px · 1 of 1 slices shown (2 of 2)]
[im 1/1  full-range]
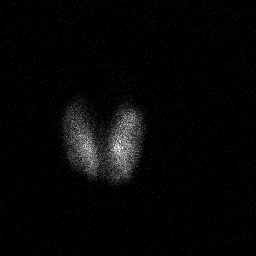

[Series 4: ant w marker · 0.59mm/px · 1 of 1 slices shown]
[im 1/1]
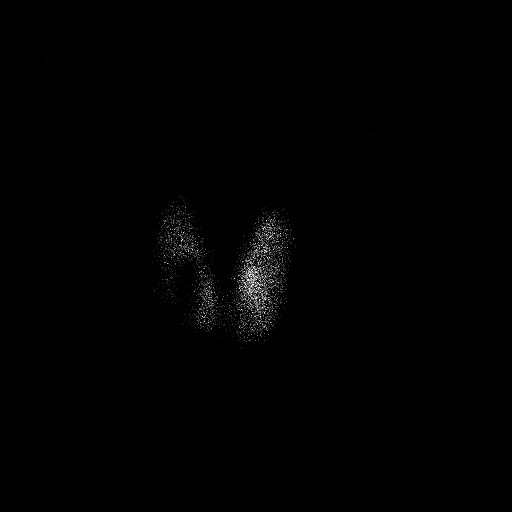

[Series 5: lao · 0.59mm/px · 1 of 1 slices shown]
[im 1/1]
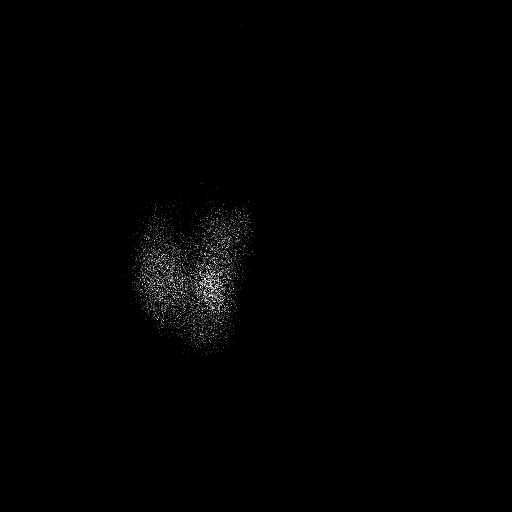

[Series 6: rao · 0.59mm/px · 1 of 1 slices shown]
[im 1/1]
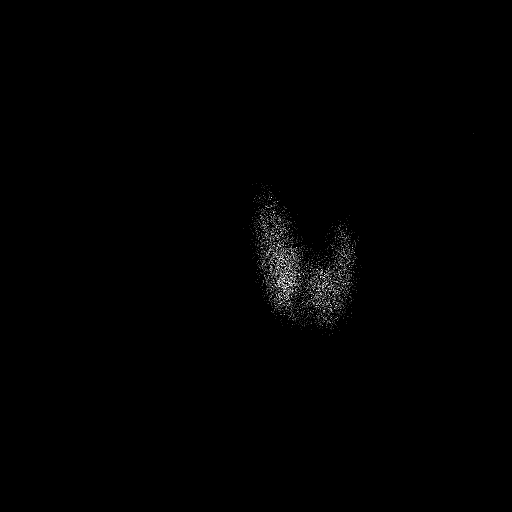

[5 of 5 positions shown; findings below may reference images not displayed]

FINDINGS: 24 hour radio iodine uptake is calculated at 71%, well above normal
range consistent with hyperthyroidism.

Images of the thyroid gland in 3 projections demonstrate homogeneous
diffusely increased tracer localization throughout both thyroid
lobes.

No definite areas of increased or decreased tracer localization
seen.
IMPRESSION: Significantly elevated 24 hour radio iodine uptake of 71%.

Homogeneous increased tracer distribution throughout both thyroid
lobes.

Findings consistent with Graves disease.

## 2018-12-08 ENCOUNTER — Other Ambulatory Visit: Payer: Self-pay | Admitting: "Endocrinology

## 2018-12-09 ENCOUNTER — Telehealth: Payer: Self-pay | Admitting: *Deleted

## 2018-12-09 NOTE — Telephone Encounter (Signed)
Patient called requesting a refill on methimazole, patient scheduled an appointment for 3/10 patient is aware to have labs done a week before appointment. Patient verbally agreed.

## 2018-12-22 ENCOUNTER — Ambulatory Visit: Payer: Medicaid Other | Admitting: "Endocrinology

## 2018-12-24 ENCOUNTER — Telehealth: Payer: Self-pay | Admitting: "Endocrinology

## 2018-12-24 ENCOUNTER — Other Ambulatory Visit: Payer: Self-pay | Admitting: "Endocrinology

## 2018-12-24 DIAGNOSIS — E0500 Thyrotoxicosis with diffuse goiter without thyrotoxic crisis or storm: Secondary | ICD-10-CM

## 2018-12-24 DIAGNOSIS — E0590 Thyrotoxicosis, unspecified without thyrotoxic crisis or storm: Secondary | ICD-10-CM

## 2018-12-24 NOTE — Telephone Encounter (Signed)
This is her her second request.  She was advised to do her labs in February.  We have to see her new labs before refilling methimazole.  she can do as soon as possible.

## 2018-12-24 NOTE — Telephone Encounter (Signed)
Pt is requesting refill on Methimazole. She states she has been out for a few weeks and feels terrible. She is having labs this week and made follow up appt.

## 2018-12-25 NOTE — Telephone Encounter (Signed)
Called pt. No answer °

## 2018-12-28 ENCOUNTER — Other Ambulatory Visit: Payer: Self-pay | Admitting: "Endocrinology

## 2018-12-28 NOTE — Telephone Encounter (Signed)
Pt notified. Advised her to go ahead and have labs done ASAP

## 2019-01-07 ENCOUNTER — Encounter: Payer: Self-pay | Admitting: "Endocrinology

## 2020-06-18 DIAGNOSIS — W57XXXA Bitten or stung by nonvenomous insect and other nonvenomous arthropods, initial encounter: Secondary | ICD-10-CM | POA: Diagnosis not present

## 2020-06-18 DIAGNOSIS — R21 Rash and other nonspecific skin eruption: Secondary | ICD-10-CM | POA: Diagnosis not present

## 2020-08-09 DIAGNOSIS — S80869D Insect bite (nonvenomous), unspecified lower leg, subsequent encounter: Secondary | ICD-10-CM | POA: Diagnosis not present

## 2020-08-09 DIAGNOSIS — W57XXXD Bitten or stung by nonvenomous insect and other nonvenomous arthropods, subsequent encounter: Secondary | ICD-10-CM | POA: Diagnosis not present

## 2020-08-09 DIAGNOSIS — R52 Pain, unspecified: Secondary | ICD-10-CM | POA: Diagnosis not present

## 2020-08-09 DIAGNOSIS — R21 Rash and other nonspecific skin eruption: Secondary | ICD-10-CM | POA: Diagnosis not present

## 2020-09-04 DIAGNOSIS — W57XXXA Bitten or stung by nonvenomous insect and other nonvenomous arthropods, initial encounter: Secondary | ICD-10-CM | POA: Diagnosis not present

## 2020-09-04 DIAGNOSIS — M791 Myalgia, unspecified site: Secondary | ICD-10-CM | POA: Diagnosis not present

## 2020-09-04 DIAGNOSIS — G4701 Insomnia due to medical condition: Secondary | ICD-10-CM | POA: Diagnosis not present

## 2021-01-05 DIAGNOSIS — O210 Mild hyperemesis gravidarum: Secondary | ICD-10-CM | POA: Diagnosis not present

## 2021-01-05 DIAGNOSIS — O99519 Diseases of the respiratory system complicating pregnancy, unspecified trimester: Secondary | ICD-10-CM | POA: Diagnosis not present

## 2021-01-05 DIAGNOSIS — R0781 Pleurodynia: Secondary | ICD-10-CM | POA: Diagnosis not present

## 2021-01-05 DIAGNOSIS — J019 Acute sinusitis, unspecified: Secondary | ICD-10-CM | POA: Diagnosis not present

## 2021-01-05 DIAGNOSIS — Z881 Allergy status to other antibiotic agents status: Secondary | ICD-10-CM | POA: Diagnosis not present

## 2021-01-05 DIAGNOSIS — Z3A00 Weeks of gestation of pregnancy not specified: Secondary | ICD-10-CM | POA: Diagnosis not present

## 2021-01-05 DIAGNOSIS — O99891 Other specified diseases and conditions complicating pregnancy: Secondary | ICD-10-CM | POA: Diagnosis not present

## 2021-01-05 DIAGNOSIS — O2340 Unspecified infection of urinary tract in pregnancy, unspecified trimester: Secondary | ICD-10-CM | POA: Diagnosis not present

## 2021-01-08 DIAGNOSIS — Z3A01 Less than 8 weeks gestation of pregnancy: Secondary | ICD-10-CM | POA: Diagnosis not present

## 2021-01-08 DIAGNOSIS — O99719 Diseases of the skin and subcutaneous tissue complicating pregnancy, unspecified trimester: Secondary | ICD-10-CM | POA: Diagnosis not present

## 2021-01-08 DIAGNOSIS — L03114 Cellulitis of left upper limb: Secondary | ICD-10-CM | POA: Diagnosis not present

## 2021-01-08 DIAGNOSIS — Z3A00 Weeks of gestation of pregnancy not specified: Secondary | ICD-10-CM | POA: Diagnosis not present

## 2021-01-08 DIAGNOSIS — O99711 Diseases of the skin and subcutaneous tissue complicating pregnancy, first trimester: Secondary | ICD-10-CM | POA: Diagnosis not present

## 2021-01-10 DIAGNOSIS — O219 Vomiting of pregnancy, unspecified: Secondary | ICD-10-CM | POA: Diagnosis not present

## 2021-01-10 DIAGNOSIS — O99891 Other specified diseases and conditions complicating pregnancy: Secondary | ICD-10-CM | POA: Diagnosis not present

## 2021-01-10 DIAGNOSIS — N3000 Acute cystitis without hematuria: Secondary | ICD-10-CM | POA: Diagnosis not present

## 2021-01-10 DIAGNOSIS — Z3A01 Less than 8 weeks gestation of pregnancy: Secondary | ICD-10-CM | POA: Diagnosis not present

## 2021-01-10 DIAGNOSIS — O2311 Infections of bladder in pregnancy, first trimester: Secondary | ICD-10-CM | POA: Diagnosis not present

## 2021-01-12 DIAGNOSIS — O219 Vomiting of pregnancy, unspecified: Secondary | ICD-10-CM | POA: Diagnosis not present

## 2021-01-31 DIAGNOSIS — Z3491 Encounter for supervision of normal pregnancy, unspecified, first trimester: Secondary | ICD-10-CM | POA: Diagnosis not present

## 2021-01-31 LAB — TSH: TSH: 0.01 — AB (ref <=5.90)

## 2021-02-19 DIAGNOSIS — R7989 Other specified abnormal findings of blood chemistry: Secondary | ICD-10-CM | POA: Diagnosis not present

## 2021-02-27 ENCOUNTER — Ambulatory Visit (INDEPENDENT_AMBULATORY_CARE_PROVIDER_SITE_OTHER): Payer: Medicaid Other | Admitting: "Endocrinology

## 2021-02-27 ENCOUNTER — Other Ambulatory Visit: Payer: Self-pay

## 2021-02-27 ENCOUNTER — Encounter: Payer: Self-pay | Admitting: "Endocrinology

## 2021-02-27 VITALS — BP 110/73 | HR 88 | Ht 69.0 in | Wt 140.0 lb

## 2021-02-27 DIAGNOSIS — Z91199 Patient's noncompliance with other medical treatment and regimen due to unspecified reason: Secondary | ICD-10-CM | POA: Insufficient documentation

## 2021-02-27 DIAGNOSIS — E0590 Thyrotoxicosis, unspecified without thyrotoxic crisis or storm: Secondary | ICD-10-CM

## 2021-02-27 DIAGNOSIS — Z3A12 12 weeks gestation of pregnancy: Secondary | ICD-10-CM

## 2021-02-27 DIAGNOSIS — Z9119 Patient's noncompliance with other medical treatment and regimen: Secondary | ICD-10-CM

## 2021-02-27 LAB — TSH: TSH: 0.01 — AB (ref 0.41–5.90)

## 2021-02-27 NOTE — Progress Notes (Signed)
Endocrinology Consult Note                                            02/27/2021, 4:07 PM   Subjective:    Patient ID: Margaret Juarez, female    DOB: 1996-07-03, PCP Massenburg, O'Laf, PA-C   Past Medical History:  Diagnosis Date  . Asthma   . Chronic pansinusitis   . Hypertension   . Von Willebrand disease (HCC)    Past Surgical History:  Procedure Laterality Date  . WISDOM TOOTH EXTRACTION     Social History   Socioeconomic History  . Marital status: Single    Spouse name: Not on file  . Number of children: Not on file  . Years of education: Not on file  . Highest education level: Not on file  Occupational History  . Not on file  Tobacco Use  . Smoking status: Never Smoker  . Smokeless tobacco: Never Used  Vaping Use  . Vaping Use: Never used  Substance and Sexual Activity  . Alcohol use: No  . Drug use: No  . Sexual activity: Yes    Birth control/protection: Injection  Other Topics Concern  . Not on file  Social History Narrative  . Not on file   Social Determinants of Health   Financial Resource Strain: Not on file  Food Insecurity: Not on file  Transportation Needs: Not on file  Physical Activity: Not on file  Stress: Not on file  Social Connections: Not on file   Family History  Problem Relation Age of Onset  . Osteoarthritis Mother   . Osteoarthritis Father   . Cancer Other   . Osteoarthritis Other    Outpatient Encounter Medications as of 02/27/2021  Medication Sig  . ondansetron (ZOFRAN-ODT) 4 MG disintegrating tablet Take 4 mg by mouth every 8 (eight) hours as needed.  . [DISCONTINUED] Doxylamine-Pyridoxine (DICLEGIS) 10-10 MG TBEC Take by mouth 2 (two) times daily.  . [DISCONTINUED] methimazole (TAPAZOLE) 5 MG tablet Take 1 tablet (5 mg total) by mouth 3 (three) times daily. (Patient not taking: Reported on 02/27/2021)  . [DISCONTINUED] Prenat w/o A-[id number]-DHA (CITRANATAL 90 DHA PO) Take by mouth daily.   No  facility-administered encounter medications on file as of 02/27/2021.   ALLERGIES: Allergies  Allergen Reactions  . Mango Butter   . Cephalexin Rash    VACCINATION STATUS:  There is no immunization history on file for this patient.  HPI Margaret Juarez is 25 y.o. female who presents today with a medical history as above. she is being seen in consultation for hyperthyroidism during first trimester pregnancy requested by Massenburg, O'Laf, PA-C. This patient was previously seen in this clinic prior to June 2019 for hyperthyroidism during pregnancy. She was treated with low-dose methimazole for mild hyperthyroidism. She was supposed to return in July 2019 for follow-up, however patient disappeared from care. -She reports intermittent treatment with methimazole in her absence from this clinic.  She ran out of her methimazole more than a month ago. She is being rereferred due to hyperthyroidism and another pregnancy. She is at 12 weeks of gestation.  She is due for her first ultrasound in a week. Her TSH was suppressed at <0.005 on January 31, 2021.  On Feb 19, 2021 Free T4 with measured and was at  3.3. Patient denies palpitations, tremors.  She has  gained 4 pounds during this pregnancy.  Her routine weight is 135-140 pounds. She reports fatigue.  She denies any family history of thyroid dysfunction.  She denies any family history of thyroid malignancy.  Review of Systems  Constitutional: No major weight change, + fatigue, + subjective hyperthermia.  Eyes: no blurry vision, no xerophthalmia ENT: no sore throat, no nodules palpated in throat, no dysphagia/odynophagia, no hoarseness Cardiovascular: no Chest Pain, no Shortness of Breath, no palpitations, no leg swelling Respiratory: no cough, no shortness of breath Gastrointestinal: no Nausea/Vomiting/Diarhhea Musculoskeletal: no muscle/joint aches Skin: no rashes Neurological: no tremors, no numbness, no tingling, no  dizziness Psychiatric: no depression, no anxiety  Objective:    Vitals with BMI 02/27/2021 03/20/2018 03/19/2018  Height 5\' 9"  5\' 9"  5\' 9"   Weight 140 lbs 162 lbs 161 lbs  BMI 20.67 23.91 23.76  Systolic 110 111  Diastolic 73 79 81  Pulse 88 79 82    BP 110/73   Pulse 88   Ht 5\' 9"  (1.753 m)   Wt 140 lb (63.5 kg)   BMI 20.67 kg/m   Wt Readings from Last 3 Encounters:  02/27/21 140 lb (63.5 kg)  03/20/18 162 lb (73.5 kg)  03/19/18 161 lb (73 kg)    Physical Exam  Constitutional:  Body mass index is 20.67 kg/m.,  not in acute distress, normal state of mind Eyes: PERRLA, EOMI, no exophthalmos ENT: moist mucous membranes, no gross thyromegaly, no gross cervical lymphadenopathy Cardiovascular: normal precordial activity, Regular Rate and Rhythm, no Murmur/Rubs/Gallops Respiratory:  adequate breathing efforts, no gross chest deformity, Clear to auscultation bilaterally Gastrointestinal: abdomen soft, Non -tender, No distension, Bowel Sounds present, no gross organomegaly Musculoskeletal: no gross deformities, strength intact in all four extremities Skin: moist, warm, no rashes Neurological: + tremor with outstretched hands, Deep tendon reflexes 2+ in bilateral lower extremities.  CMP ( most recent) CMP     Component Value Date/Time   NA 137 03/19/2018 0920   K 4.0 03/19/2018 0920   CL 104 03/19/2018 0920   CO2 24 03/19/2018 0920   GLUCOSE 92 03/19/2018 0920   BUN 9 03/19/2018 0920   CREATININE 0.46 (L) 03/19/2018 0920   CALCIUM 8.8 03/19/2018 0920   PROT 7.1 03/19/2018 0920   PROT 7.1 03/19/2018 0920   AST 10 03/19/2018 0920   AST 10 03/19/2018 0920   ALT 9 03/19/2018 0920   ALT 9 03/19/2018 0920   BILITOT 0.2 03/19/2018 0920   BILITOT 0.2 03/19/2018 0920   GFRNONAA 142 03/19/2018 0920   GFRAA 165 03/19/2018 0920      Lab Results  Component Value Date   TSH 0.01 (A) 01/31/2021   TSH <0.01 (L) 03/19/2018   FREET4 1.2 03/19/2018      Assessment & Plan:    1. Hyperthyroidism 2. [redacted] weeks gestation of pregnancy  - VIKI CARRERA  is being seen at a kind request of Massenburg, O'Laf, PA-C. - I have reviewed her available thyroid records and clinically evaluated the patient. This patient was previously seen for the same diagnosis prior to June 2019.  She was treated for hyperthyroidism during pregnancy with methimazole.  Unfortunately, patient did not return for follow-up visit.  She is being rereferred for the same diagnosis during another pregnancy.  She is pregnant at [redacted] weeks of gestation.  She has not taken methimazole for more than a month. -She will be sent for more complete labs today and patient will return tomorrow for a follow-up. -She  will likely need antithyroid intervention with PTU for the remaining week of the first trimester and switch to methimazole from second trimester on. -Patient is counseled about the need for adherence to follow-up and measurements for optimal outcome of this pregnancy. -Labs will include TSH, free T4/free T3, and antithyroid antibodies. -Even if she is confirmed to have primary hyperthyroidism, she will not be exposed to I-131 for scanning or treatment purposes  until the current pregnancy is complete.  - I did not initiate any new prescriptions today. -This patient seems to have significant  Primary hypothyroidism.  After completion of her current pregnancy, she will be reapproached for uptake and scan and possible definitive treatment with I-131.   - she is advised to maintain close follow up with Massenburg, O'Laf, PA-C for primary care needs.   - Time spent with the patient: 60 minutes, of which >50% was spent in  counseling her about her hyperthyroidism during pregnancy and the rest in obtaining information about her symptoms, reviewing her previous labs/studies ( including abstractions from other facilities),  evaluations, and treatments,  and developing a plan to confirm diagnosis and long term  treatment based on the latest standards of care/guidelines; and documenting her care.  Margaret Juarez participated in the discussions, expressed understanding, and voiced agreement with the above plans.  All questions were answered to her satisfaction. she is encouraged to contact clinic should she have any questions or concerns prior to her return visit.  Follow up plan: Return in about 1 day (around 02/28/2021), or Tomorrow PM, for F/U with Pre-visit Labs.   Marquis Lunch, MD [facility] [address] [place], [place] [address] Phone: [phone]  Fax: [phone]     02/27/2021, 4:07 PM  This note was partially dictated with voice recognition software. Similar sounding words can be transcribed inadequately or may not  be corrected upon review.

## 2021-02-28 ENCOUNTER — Ambulatory Visit (INDEPENDENT_AMBULATORY_CARE_PROVIDER_SITE_OTHER): Payer: Medicaid Other | Admitting: "Endocrinology

## 2021-02-28 ENCOUNTER — Encounter: Payer: Self-pay | Admitting: "Endocrinology

## 2021-02-28 VITALS — BP 114/80 | HR 101 | Ht 69.0 in | Wt 139.0 lb

## 2021-02-28 DIAGNOSIS — E0590 Thyrotoxicosis, unspecified without thyrotoxic crisis or storm: Secondary | ICD-10-CM | POA: Diagnosis not present

## 2021-02-28 DIAGNOSIS — Z3A12 12 weeks gestation of pregnancy: Secondary | ICD-10-CM

## 2021-02-28 DIAGNOSIS — Z3491 Encounter for supervision of normal pregnancy, unspecified, first trimester: Secondary | ICD-10-CM | POA: Diagnosis not present

## 2021-02-28 DIAGNOSIS — Z3481 Encounter for supervision of other normal pregnancy, first trimester: Secondary | ICD-10-CM | POA: Diagnosis not present

## 2021-02-28 DIAGNOSIS — Z3490 Encounter for supervision of normal pregnancy, unspecified, unspecified trimester: Secondary | ICD-10-CM | POA: Diagnosis not present

## 2021-02-28 LAB — THYROGLOBULIN ANTIBODY: Thyroglobulin Antibody: 1.4 IU/mL — ABNORMAL HIGH (ref 0.0–0.9)

## 2021-02-28 LAB — T4, FREE: Free T4: 2.61 ng/dL — ABNORMAL HIGH (ref 0.82–1.77)

## 2021-02-28 LAB — T3, FREE: T3, Free: 7.5 pg/mL — ABNORMAL HIGH (ref 2.0–4.4)

## 2021-02-28 LAB — TSH: TSH: <0.005 u[IU]/mL — ABNORMAL LOW (ref 0.450–4.500)

## 2021-02-28 LAB — THYROID PEROXIDASE ANTIBODY: Thyroperoxidase Ab SerPl-aCnc: 105 IU/mL — ABNORMAL HIGH (ref 0–34)

## 2021-02-28 MED ORDER — PROPYLTHIOURACIL 50 MG PO TABS: 50.0000 mg | ORAL_TABLET | Freq: Two times a day (BID) | ORAL | 0 refills | Status: DC

## 2021-02-28 NOTE — Progress Notes (Signed)
02/28/2021, 6:11 PM  Endocrinology follow-up note   Subjective:    Patient ID: Margaret Juarez, female    DOB: 1996-10-04, PCP Massenburg, O'Laf, PA-C   Past Medical History:  Diagnosis Date  . Asthma   . Chronic pansinusitis   . Hypertension   . Von Willebrand disease (HCC)    Past Surgical History:  Procedure Laterality Date  . WISDOM TOOTH EXTRACTION     Social History   Socioeconomic History  . Marital status: Single    Spouse name: Not on file  . Number of children: Not on file  . Years of education: Not on file  . Highest education level: Not on file  Occupational History  . Not on file  Tobacco Use  . Smoking status: Never Smoker  . Smokeless tobacco: Never Used  Vaping Use  . Vaping Use: Never used  Substance and Sexual Activity  . Alcohol use: No  . Drug use: No  . Sexual activity: Yes    Birth control/protection: Injection  Other Topics Concern  . Not on file  Social History Narrative  . Not on file   Social Determinants of Health   Financial Resource Strain: Not on file  Food Insecurity: Not on file  Transportation Needs: Not on file  Physical Activity: Not on file  Stress: Not on file  Social Connections: Not on file   Family History  Problem Relation Age of Onset  . Osteoarthritis Mother   . Osteoarthritis Father   . Cancer Other   . Osteoarthritis Other    Outpatient Encounter Medications as of 02/28/2021  Medication Sig  . propylthiouracil (PTU) 50 MG tablet Take 1 tablet (50 mg total) by mouth 2 (two) times daily.  . ondansetron (ZOFRAN-ODT) 4 MG disintegrating tablet Take 4 mg by mouth every 8 (eight) hours as needed.   No facility-administered encounter medications on file as of 02/28/2021.   ALLERGIES: Allergies  Allergen Reactions  . Mango Butter   . Cephalexin Rash    VACCINATION STATUS:  There is no immunization history on file for this patient.  HPI Margaret Juarez is 25 y.o. female who presents today with new set of thyroid function test after she was seen in consultation for hyperthyroidism.  She is pregnant at [redacted] weeks of gestation.  PCP: Reather Converse, PA-C. This patient was previously seen in this clinic prior to June 2019 for hyperthyroidism during pregnancy. She was treated with low-dose methimazole for mild hyperthyroidism. She was supposed to return in July 2019 for follow-up, however patient disappeared from care. -She reported that she did not take methimazole more than a month.  Her previsit labs are confirming clinically significant primary hyperthyroidism-see below.  She is at 12 weeks of gestation.  She is due for her first ultrasound in a week. Patient denies palpitations, tremors.  She has gained 4 pounds during this pregnancy.  Her routine weight is 135-140 pounds. She reports fatigue.  She denies any family history of thyroid dysfunction.  She denies any family history of thyroid malignancy.  Review of Systems  Constitutional: No major weight change, + fatigue, + subjective hyperthermia.   Objective:    Vitals with BMI 02/28/2021 02/27/2021 03/20/2018  Height 5\' 9"  5\' 9"  5\' 9"   Weight 139 lbs 140 lbs 162 lbs  BMI 20.52 20.67 23.91  Systolic 114 110  Diastolic 80 73 79  Pulse 101 88 79    BP 114/80   Pulse (!) 101   Ht 5\' 9"  (1.753 m)   Wt 139 lb (63 kg)   BMI 20.53 kg/m   Wt Readings from Last 3 Encounters:  02/28/21 139 lb (63 kg)  02/27/21 140 lb (63.5 kg)  03/20/18 162 lb (73.5 kg)    Physical Exam  Constitutional:  Body mass index is 20.53 kg/m.,  not in acute distress, normal state of mind Eyes: PERRLA, EOMI, no exophthalmos ENT: moist mucous membranes, no gross thyromegaly, no gross cervical lymphadenopathy Cardiovascular:  + Mild tachycardia 101.    Skin: moist, warm, no rashes Neurological: + tremor with outstretched hands, Deep tendon reflexes 2+ in bilateral lower extremities.  CMP (  most recent) CMP     Component Value Date/Time   NA 137 03/19/2018 0920   K 4.0 03/19/2018 0920   CL 104 03/19/2018 0920   CO2 24 03/19/2018 0920   GLUCOSE 92 03/19/2018 0920   BUN 9 03/19/2018 0920   CREATININE 0.46 (L) 03/19/2018 0920   CALCIUM 8.8 03/19/2018 0920   PROT 7.1 03/19/2018 0920   PROT 7.1 03/19/2018 0920   AST 10 03/19/2018 0920   AST 10 03/19/2018 0920   ALT 9 03/19/2018 0920   ALT 9 03/19/2018 0920   BILITOT 0.2 03/19/2018 0920   BILITOT 0.2 03/19/2018 0920   GFRNONAA 142 03/19/2018 0920   GFRAA 165 03/19/2018 0920      Lab Results  Component Value Date   TSH <0.005 (L) 02/27/2021   TSH 0.01 (A) 01/31/2021   TSH <0.01 (L) 03/19/2018   FREET4 2.61 (H) 02/27/2021   FREET4 1.2 03/19/2018      Assessment & Plan:   1. Hyperthyroidism 2. [redacted] weeks gestation of pregnancy  Ms. Sharrar presents with lab evidence of significant primary hyperthyroidism at 12 weeks of gestation.  I discussed her lab results and plan for treatment. She is known to have hypothyroidism for several years, disappeared from care since 2019.  She has not taking any treatment for more than a month. -In light of the safety concerns of antithyroid medications, until she completes her first trimester she will be treated with propylthiouracil. I discussed and initiated PTU 50 mg p.o. twice daily with breakfast and supper with plan to repeat thyroid function test in 2 weeks and office visit.  At that time, she will be switched to methimazole 5-10 mg p.o. daily depending on her response to PTU in the next 2 weeks.  -The mild tachycardia 101 could be the result of her pregnancy.  She is counseled to monitor her pulse rate, may need beta-blocker if she is having sustained tachycardia outside of expected for pregnancy.    -Patient is counseled about the need for adherence to follow-up and measurements for optimal outcome of this pregnancy. -Her labs have evidence of antithyroid antibodies suggesting  Graves' disease. -After her pregnancy is complete, she will be considered for thyroid uptake and scan and possible ablation with I-131.   - she is advised to maintain close follow up with Massenburg, O'Laf, PA-C for primary care needs.   I spent 25 minutes in the care of the patient today including review of labs from Thyroid Function, CMP, and other relevant labs ; imaging/biopsy records (current and previous including  abstractions from other facilities); face-to-face time discussing  her lab results and symptoms, medications doses, her options of short and long term treatment based on the latest standards of care / guidelines;   and documenting the encounter.  Margaret Juarez  participated in the discussions, expressed understanding, and voiced agreement with the above plans.  All questions were answered to her satisfaction. she is encouraged to contact clinic should she have any questions or concerns prior to her return visit.  Follow up plan: Return in about 2 weeks (around 03/14/2021), or Labs on Mar 12, 2021, for F/U with Pre-visit Labs.   Marquis Lunch, MD [facility] [address] [place], [place] [address] Phone: [phone]  Fax: [phone]     02/28/2021, 6:11 PM  This note was partially dictated with voice recognition software. Similar sounding words can be transcribed inadequately or may not  be corrected upon review.

## 2021-03-14 ENCOUNTER — Other Ambulatory Visit: Payer: Self-pay | Admitting: "Endocrinology

## 2021-03-14 ENCOUNTER — Ambulatory Visit: Payer: Medicaid Other | Admitting: "Endocrinology

## 2021-03-19 ENCOUNTER — Ambulatory Visit: Payer: Medicaid Other | Admitting: "Endocrinology

## 2021-03-28 DIAGNOSIS — Z3689 Encounter for other specified antenatal screening: Secondary | ICD-10-CM | POA: Diagnosis not present

## 2021-04-19 DIAGNOSIS — Z3689 Encounter for other specified antenatal screening: Secondary | ICD-10-CM | POA: Diagnosis not present

## 2021-06-04 DIAGNOSIS — Z23 Encounter for immunization: Secondary | ICD-10-CM | POA: Diagnosis not present

## 2021-06-04 DIAGNOSIS — Z3689 Encounter for other specified antenatal screening: Secondary | ICD-10-CM | POA: Diagnosis not present

## 2021-07-02 DIAGNOSIS — Z3A30 30 weeks gestation of pregnancy: Secondary | ICD-10-CM | POA: Diagnosis not present

## 2021-07-02 DIAGNOSIS — R110 Nausea: Secondary | ICD-10-CM | POA: Diagnosis not present

## 2021-07-02 DIAGNOSIS — M79662 Pain in left lower leg: Secondary | ICD-10-CM | POA: Diagnosis not present

## 2021-07-02 DIAGNOSIS — O26893 Other specified pregnancy related conditions, third trimester: Secondary | ICD-10-CM | POA: Diagnosis not present

## 2021-07-02 DIAGNOSIS — O368130 Decreased fetal movements, third trimester, not applicable or unspecified: Secondary | ICD-10-CM | POA: Diagnosis not present

## 2021-07-02 DIAGNOSIS — M79605 Pain in left leg: Secondary | ICD-10-CM | POA: Diagnosis not present

## 2021-07-02 DIAGNOSIS — O99891 Other specified diseases and conditions complicating pregnancy: Secondary | ICD-10-CM | POA: Diagnosis not present

## 2021-07-02 DIAGNOSIS — R1030 Lower abdominal pain, unspecified: Secondary | ICD-10-CM | POA: Diagnosis not present

## 2021-07-03 DIAGNOSIS — M79605 Pain in left leg: Secondary | ICD-10-CM | POA: Diagnosis not present

## 2021-07-03 DIAGNOSIS — O99891 Other specified diseases and conditions complicating pregnancy: Secondary | ICD-10-CM | POA: Diagnosis not present

## 2021-07-03 DIAGNOSIS — R252 Cramp and spasm: Secondary | ICD-10-CM | POA: Diagnosis not present

## 2021-07-03 DIAGNOSIS — M79662 Pain in left lower leg: Secondary | ICD-10-CM | POA: Diagnosis not present

## 2021-07-03 DIAGNOSIS — Z3A00 Weeks of gestation of pregnancy not specified: Secondary | ICD-10-CM | POA: Diagnosis not present

## 2021-07-25 DIAGNOSIS — O99283 Endocrine, nutritional and metabolic diseases complicating pregnancy, third trimester: Secondary | ICD-10-CM | POA: Diagnosis not present

## 2021-07-25 DIAGNOSIS — E0590 Thyrotoxicosis, unspecified without thyrotoxic crisis or storm: Secondary | ICD-10-CM | POA: Diagnosis not present

## 2021-08-08 DIAGNOSIS — O368130 Decreased fetal movements, third trimester, not applicable or unspecified: Secondary | ICD-10-CM | POA: Diagnosis not present

## 2021-08-08 DIAGNOSIS — Z3689 Encounter for other specified antenatal screening: Secondary | ICD-10-CM | POA: Diagnosis not present

## 2021-08-15 DIAGNOSIS — E0500 Thyrotoxicosis with diffuse goiter without thyrotoxic crisis or storm: Secondary | ICD-10-CM | POA: Diagnosis not present

## 2021-08-15 DIAGNOSIS — N898 Other specified noninflammatory disorders of vagina: Secondary | ICD-10-CM | POA: Diagnosis not present

## 2021-08-15 DIAGNOSIS — O99013 Anemia complicating pregnancy, third trimester: Secondary | ICD-10-CM | POA: Diagnosis not present

## 2021-08-15 DIAGNOSIS — Z3689 Encounter for other specified antenatal screening: Secondary | ICD-10-CM | POA: Diagnosis not present

## 2021-08-17 DIAGNOSIS — I10 Essential (primary) hypertension: Secondary | ICD-10-CM | POA: Diagnosis not present

## 2021-08-17 DIAGNOSIS — O479 False labor, unspecified: Secondary | ICD-10-CM | POA: Diagnosis not present

## 2021-08-17 DIAGNOSIS — R58 Hemorrhage, not elsewhere classified: Secondary | ICD-10-CM | POA: Diagnosis not present

## 2021-08-17 DIAGNOSIS — Z3A37 37 weeks gestation of pregnancy: Secondary | ICD-10-CM | POA: Diagnosis not present

## 2021-09-28 LAB — CYTOLOGY - PAP

## 2021-12-12 DIAGNOSIS — N760 Acute vaginitis: Secondary | ICD-10-CM | POA: Diagnosis not present

## 2022-01-14 DIAGNOSIS — J019 Acute sinusitis, unspecified: Secondary | ICD-10-CM | POA: Diagnosis not present

## 2022-04-03 DIAGNOSIS — N760 Acute vaginitis: Secondary | ICD-10-CM | POA: Diagnosis not present

## 2022-04-03 DIAGNOSIS — R1030 Lower abdominal pain, unspecified: Secondary | ICD-10-CM | POA: Diagnosis not present

## 2022-04-25 DIAGNOSIS — R1030 Lower abdominal pain, unspecified: Secondary | ICD-10-CM | POA: Diagnosis not present

## 2022-04-25 DIAGNOSIS — N83201 Unspecified ovarian cyst, right side: Secondary | ICD-10-CM | POA: Diagnosis not present

## 2022-06-10 ENCOUNTER — Encounter: Payer: Self-pay | Admitting: Nurse Practitioner

## 2022-06-10 ENCOUNTER — Ambulatory Visit: Payer: Medicaid Other | Admitting: Nurse Practitioner

## 2022-06-10 VITALS — BP 122/74 | HR 76 | Temp 98.7°F | Resp 18 | Ht 69.0 in | Wt 205.0 lb

## 2022-06-10 DIAGNOSIS — N83201 Unspecified ovarian cyst, right side: Secondary | ICD-10-CM

## 2022-06-10 DIAGNOSIS — F419 Anxiety disorder, unspecified: Secondary | ICD-10-CM

## 2022-06-10 DIAGNOSIS — Z309 Encounter for contraceptive management, unspecified: Secondary | ICD-10-CM

## 2022-06-10 LAB — PREGNANCY, URINE: Preg Test, Ur: NEGATIVE

## 2022-06-10 MED ORDER — ETONOGESTREL-ETHINYL ESTRADIOL 0.12-0.015 MG/24HR VA RING: VAGINAL_RING | VAGINAL | 12 refills | Status: DC

## 2022-06-10 MED ORDER — BUSPIRONE HCL 5 MG PO TABS: 5.0000 mg | ORAL_TABLET | Freq: Two times a day (BID) | ORAL | 1 refills | Status: DC

## 2022-06-10 NOTE — Assessment & Plan Note (Signed)
Anxiety not well controlled symptoms are worse after having her baby 9 months ago.  Patient constantly up testing over her baby safety at night.  We talked about anxiety/stress management, increasing quality of sleep, using a baby bedside alarm to alert mom when baby changes position.  Started patient on BuSpar 5 mg tablet by mouth twice daily.  Follow-up in 6 months.

## 2022-06-10 NOTE — Assessment & Plan Note (Signed)
Patient currently on oral birth control pills and attributes that to her weight gain.  Patient believes she has gained 30 pounds after starting her pills.  I provided education to patient on how to increase her body metabolism, getting enough sleep and rest, increasing hydration, exercising and low calorie diet.  Started patient on NuvaRing and provided education on how to use it.  Patient verbalized understanding  Completed urine pregnancy test results pending.  Follow-up with as needed

## 2022-06-10 NOTE — Progress Notes (Signed)
New Patient Note  RE: Margaret Juarez MRN: [id number] DOB: 01/18/1996 Date of Office Visit: 06/10/2022  Chief Complaint: Establish Care and Anxiety  History of Present Illness:  Patient is new establishing care today with complaints of anxiety, weight gain, ovarian cyst and encounter for birth control. Anxiety: Patient complains of anxiety disorder.  She has the following symptoms: difficulty concentrating, irritable. Onset of symptoms was approximately a few months ago, unchanged since that time. She denies current suicidal and homicidal ideation. Family history significant for no psychiatric illness.Possible organic causes contributing are: none. Risk factors: none Previous treatment includes  No medication for  now  and none.  She complains of the following side effects from the treatment: none.    Pain  She reports new onset right ovarie pain. was not an injury that may have caused the pain. The pain started a few months ago and is staying constant. The pain does not radiate . The pain is described as soreness, is 6/10 in intensity, occurring intermittently. Symptoms are worse in the: mid-day, afternoon, evening  Aggravating factors: none Relieving factors: none.  She has tried application of heat with little relief.       06/10/2022    2:05 PM  GAD 7 : Generalized Anxiety Score  Nervous, Anxious, on Edge 3  Control/stop worrying 3  Worry too much - different things 3  Trouble relaxing 3  Restless 1  Easily annoyed or irritable 3  Afraid - awful might happen 3  Total GAD 7 Score 19  Anxiety Difficulty Extremely difficult      Assessment and Plan: Margaret Juarez is a 26 y.o. female with: Cyst of right ovary Patient's recent ultrasound shows right ovarian cyst.  Education provided to patient.  Started patient on anti-inflammatory ibuprofen 400 mg tablet by mouth as needed for pain, heat/warm compress as needed.  Increase hydration, follow-up with unresolved  symptoms.  Anxiety Anxiety not well controlled symptoms are worse after having her baby 9 months ago.  Patient constantly up testing over her baby safety at night.  We talked about anxiety/stress management, increasing quality of sleep, using a baby bedside alarm to alert mom when baby changes position.  Started patient on BuSpar 5 mg tablet by mouth twice daily.  Follow-up in 6 months.  Encounter for contraceptive management Patient currently on oral birth control pills and attributes that to her weight gain.  Patient believes she has gained 30 pounds after starting her pills.  I provided education to patient on how to increase her body metabolism, getting enough sleep and rest, increasing hydration, exercising and low calorie diet.  Started patient on NuvaRing and provided education on how to use it.  Patient verbalized understanding  Completed urine pregnancy test results pending.  Follow-up with as needed  Return in about 6 weeks (around 07/22/2022) for anxiety.   Diagnostics:   Past Medical History: Patient Active Problem List   Diagnosis Date Noted   Cyst of right ovary 06/10/2022   Anxiety 06/10/2022   Encounter for contraceptive management 06/10/2022   [redacted] weeks gestation of pregnancy 02/27/2021   Nonadherence to medical treatment 02/27/2021   Hyperthyroidism 03/19/2018   Graves' disease 03/19/2018   [redacted] weeks gestation of pregnancy 03/19/2018   Past Medical History:  Diagnosis Date   Asthma    Chronic pansinusitis    Graves disease 2018   Von Willebrand disease (HCC)    Past Surgical History: Past Surgical History:  Procedure Laterality Date   WISDOM TOOTH EXTRACTION  Medication List:  Current Outpatient Medications  Medication Sig Dispense Refill   busPIRone (BUSPAR) 5 MG tablet Take 1 tablet (5 mg total) by mouth 2 (two) times daily. 60 tablet 1   etonogestrel-ethinyl estradiol (NUVARING) 0.12-0.015 MG/24HR vaginal ring Insert vaginally and leave in place for  3 consecutive weeks, then remove for 1 week. 3 each 12   ondansetron (ZOFRAN-ODT) 4 MG disintegrating tablet Take 4 mg by mouth every 8 (eight) hours as needed.     propylthiouracil (PTU) 50 MG tablet Take 1 tablet (50 mg total) by mouth 2 (two) times daily. 30 tablet 0   No current facility-administered medications for this visit.   Allergies: Allergies  Allergen Reactions   Mangifera Indica Anaphylaxis   Mango Butter    Cephalexin Hives   Social History: Social History   Socioeconomic History   Marital status: Single    Spouse name: Not on file   Number of children: Not on file   Years of education: Not on file   Highest education level: Not on file  Occupational History   Not on file  Tobacco Use   Smoking status: Never   Smokeless tobacco: Never  Vaping Use   Vaping Use: Never used  Substance and Sexual Activity   Alcohol use: No   Drug use: Not on file   Sexual activity: Yes    Birth control/protection: Pill  Other Topics Concern   Not on file  Social History Narrative   Not on file   Social Determinants of Health   Financial Resource Strain: Not on file  Food Insecurity: Not on file  Transportation Needs: Not on file  Physical Activity: Not on file  Stress: Not on file  Social Connections: Not on file       Family History: Family History  Problem Relation Age of Onset   Osteoarthritis Mother    Diabetes Father    Heart disease Father    Osteoarthritis Father    Osteoarthritis Paternal Aunt    Breast cancer Paternal Aunt          Review of Systems  Constitutional: Negative.   HENT: Negative.    Eyes: Negative.   Respiratory: Negative.    Skin: Negative.  Negative for pallor and rash.  Psychiatric/Behavioral:  Negative for self-injury. The patient is nervous/anxious.   All other systems reviewed and are negative.  Objective: BP 122/74   Pulse 76   Temp 98.7 F (37.1 C)   Resp 18   Ht 5\' 9"  (1.753 m)   Wt 205 lb (93 kg)   LMP  06/08/2022 Comment: start date  SpO2 99%   Breastfeeding No Comment: never started  BMI 30.27 kg/m  Body mass index is 30.27 kg/m. Physical Exam Vitals and nursing note reviewed.  Constitutional:      Appearance: Normal appearance.  HENT:     Head: Normocephalic.     Right Ear: External ear normal.     Left Ear: External ear normal.     Nose: Nose normal.     Mouth/Throat:     Mouth: Mucous membranes are moist.     Pharynx: Oropharynx is clear.  Eyes:     Conjunctiva/sclera: Conjunctivae normal.  Cardiovascular:     Rate and Rhythm: Normal rate and regular rhythm.     Pulses: Normal pulses.     Heart sounds: Normal heart sounds.  Pulmonary:     Effort: Pulmonary effort is normal.     Breath sounds: Normal breath sounds.  Abdominal:     General: Bowel sounds are normal.  Musculoskeletal:        General: Normal range of motion.  Skin:    General: Skin is warm.     Findings: No rash.  Neurological:     General: No focal deficit present.     Mental Status: She is alert and oriented to person, place, and time.  Psychiatric:        Mood and Affect: Mood is anxious. Mood is not depressed.        Speech: Speech normal.        Behavior: Behavior normal.    The plan was reviewed with the patient/family, and all questions/concerned were addressed.  It was my pleasure to see Margaret Juarez today and participate in her care. Please feel free to contact me with any questions or concerns.  Sincerely,  Lynnell Chad NP [facility]

## 2022-06-10 NOTE — Patient Instructions (Signed)

## 2022-06-10 NOTE — Assessment & Plan Note (Signed)
Patient's recent ultrasound shows right ovarian cyst.  Education provided to patient.  Started patient on anti-inflammatory ibuprofen 400 mg tablet by mouth as needed for pain, heat/warm compress as needed.  Increase hydration, follow-up with unresolved symptoms.

## 2022-07-22 ENCOUNTER — Ambulatory Visit: Payer: Medicaid Other | Admitting: Nurse Practitioner

## 2022-07-22 ENCOUNTER — Encounter: Payer: Self-pay | Admitting: Nurse Practitioner

## 2022-07-22 VITALS — BP 130/77 | HR 91 | Temp 98.5°F | Ht 69.0 in | Wt 212.6 lb

## 2022-07-22 DIAGNOSIS — G4489 Other headache syndrome: Secondary | ICD-10-CM | POA: Diagnosis not present

## 2022-07-22 DIAGNOSIS — F419 Anxiety disorder, unspecified: Secondary | ICD-10-CM

## 2022-07-22 NOTE — Progress Notes (Signed)
Established Patient Office Visit  Subjective   Patient ID: Margaret Juarez, female    DOB: 16-Aug-1996  Age: 26 y.o. MRN: [id number]  Chief Complaint  Patient presents with   Medical Management of Chronic Issues   Anxiety    Pt states this has been doing better    Headache    Pt states this has been going on for 4 weeks now - thinks it is her new medication     Anxiety Presents for follow-up visit. Symptoms include excessive worry, nervous/anxious behavior and restlessness. Patient reports no chest pain, compulsions, confusion, depressed mood, feeling of choking or muscle tension. Symptoms occur constantly. The severity of symptoms is mild. The quality of sleep is good. Nighttime awakenings: none.    Headache  This is a new problem. The current episode started 1 to 4 weeks ago. The problem occurs constantly. The problem has been unchanged. The pain is located in the Frontal region. The pain does not radiate. The pain quality is similar to prior headaches. The quality of the pain is described as aching and pulsating. The pain is at a severity of 5/10. The pain is moderate. Pertinent negatives include no abdominal pain, abnormal behavior, anorexia, back pain, eye redness, fever, muscle aches, neck pain, phonophobia, photophobia, sinus pressure, sore throat or visual change. The symptoms are aggravated by medications. Treatments tried: Ice pack rest.  .    07/22/2022    2:12 PM 06/10/2022    2:05 PM  GAD 7 : Generalized Anxiety Score  Nervous, Anxious, on Edge 1 3  Control/stop worrying 1 3  Worry too much - different things 1 3  Trouble relaxing 1 3  Restless 0 1  Easily annoyed or irritable 1 3  Afraid - awful might happen 0 3  Total GAD 7 Score 5 19  Anxiety Difficulty Somewhat difficult Extremely difficult      Patient Active Problem List   Diagnosis Date Noted   Cyst of right ovary 06/10/2022   Anxiety 06/10/2022   Encounter for contraceptive management 06/10/2022   [redacted]  weeks gestation of pregnancy 02/27/2021   Nonadherence to medical treatment 02/27/2021   Hyperthyroidism 03/19/2018   Graves' disease 03/19/2018   [redacted] weeks gestation of pregnancy 03/19/2018   Past Medical History:  Diagnosis Date   Asthma    Chronic pansinusitis    Graves disease 2018   Von Willebrand disease (HCC)    Past Surgical History:  Procedure Laterality Date   WISDOM TOOTH EXTRACTION     Social History   Tobacco Use   Smoking status: Never   Smokeless tobacco: Never  Vaping Use   Vaping Use: Never used  Substance Use Topics   Alcohol use: No   Social History   Socioeconomic History   Marital status: Single    Spouse name: Not on file   Number of children: Not on file   Years of education: Not on file   Highest education level: Not on file  Occupational History   Not on file  Tobacco Use   Smoking status: Never   Smokeless tobacco: Never  Vaping Use   Vaping Use: Never used  Substance and Sexual Activity   Alcohol use: No   Drug use: Not on file   Sexual activity: Yes    Birth control/protection: Inserts    Comment: NUVARING  Other Topics Concern   Not on file  Social History Narrative   Not on file   Social Determinants of Health  Financial Resource Strain: Not on file  Food Insecurity: Not on file  Transportation Needs: Not on file  Physical Activity: Not on file  Stress: Not on file  Social Connections: Not on file  Intimate Partner Violence: Not on file   Family Status  Relation Name Status   Mother  Alive   Father  Alive   Emelda Brothers  Alive   Family History  Problem Relation Age of Onset   Osteoarthritis Mother    Diabetes Father    Heart disease Father    Osteoarthritis Father    Osteoarthritis Paternal Aunt    Breast cancer Paternal Aunt    Allergies  Allergen Reactions   Mangifera Indica Anaphylaxis   Mango Butter    Cephalexin Hives      Review of Systems  Constitutional: Negative.  Negative for chills and fever.   HENT: Negative.  Negative for sinus pressure and sore throat.   Eyes:  Negative for photophobia and redness.  Cardiovascular:  Negative for chest pain.  Gastrointestinal:  Negative for abdominal pain and anorexia.  Musculoskeletal:  Negative for back pain and neck pain.  Skin: Negative.  Negative for itching and rash.  Neurological:  Positive for headaches.  Psychiatric/Behavioral:  Negative for confusion. The patient is nervous/anxious.   All other systems reviewed and are negative.     Objective:     BP 130/77   Pulse 91   Temp 98.5 F (36.9 C)   Ht 5\' 9"  (1.753 m)   Wt 212 lb 9.6 oz (96.4 kg)   LMP 07/10/2022 (Exact Date) Comment: start date - pt is on NUVARING  SpO2 99%   BMI 31.40 kg/m  BP Readings from Last 3 Encounters:  07/22/22 130/77  06/10/22 122/74  02/28/21 114/80   Wt Readings from Last 3 Encounters:  07/22/22 212 lb 9.6 oz (96.4 kg)  06/10/22 205 lb (93 kg)  02/28/21 139 lb (63 kg)      Physical Exam Vitals and nursing note reviewed.  Constitutional:      Appearance: She is well-developed. She is obese.  HENT:     Head: Normocephalic.  Cardiovascular:     Rate and Rhythm: Normal rate and regular rhythm.     Heart sounds: Normal heart sounds.  Pulmonary:     Effort: Pulmonary effort is normal.     Breath sounds: Normal breath sounds.  Abdominal:     General: Bowel sounds are normal.     Palpations: Abdomen is soft.  Musculoskeletal:        General: Normal range of motion.  Skin:    General: Skin is warm.     Coloration: Skin is not pale.     Findings: No rash.  Neurological:     Mental Status: She is alert and oriented to person, place, and time.  Psychiatric:        Mood and Affect: Mood normal.      No results found for any visits on 07/22/22.  Last CBC No results found for: "WBC", "HGB", "HCT", "MCV", "MCH", "RDW", "PLT" Last metabolic panel Lab Results  Component Value Date   GLUCOSE 92 03/19/2018   NA 137 03/19/2018   K  4.0 03/19/2018   CL 104 03/19/2018   CO2 24 03/19/2018   BUN 9 03/19/2018   CREATININE 0.46 (L) 03/19/2018   GFRNONAA 142 03/19/2018   CALCIUM 8.8 03/19/2018   PROT 7.1 03/19/2018   PROT 7.1 03/19/2018   BILITOT 0.2 03/19/2018   BILITOT  0.2 03/19/2018   AST 10 03/19/2018   AST 10 03/19/2018   ALT 9 03/19/2018   ALT 9 03/19/2018      The ASCVD Risk score (Arnett DK, et al., 2019) failed to calculate for the following reasons:   The 2019 ASCVD risk score is only valid for ages 20 to 68    Assessment & Plan:  Concerning patient's headaches patient reports symptoms started with use of NuvaRing.  Education provided to patient to stop NuvaRing for about a week to see if symptoms are relieved.  Advised to increase hydration, avoid caffeine, rest, Tylenol/Excedrin as needed for headache.  Follow-up with unresolved symptoms. Problem List Items Addressed This Visit       Other   Anxiety - Primary    Patient's anxiety is well controlled with current medication BuSpar 5 mg tablet by mouth twice daily.  Follow-up in 3 months.  Completed GAD-7.      Other Visit Diagnoses     Other headache syndrome           Return in about 3 months (around 10/22/2022) for anxiety.    Ivy Lynn, NP

## 2022-07-22 NOTE — Assessment & Plan Note (Signed)
Patient's anxiety is well controlled with current medication BuSpar 5 mg tablet by mouth twice daily.  Follow-up in 3 months.  Completed GAD-7.

## 2022-07-22 NOTE — Patient Instructions (Signed)
Tension Headache, Adult A tension headache is a feeling of pain, pressure, or aching in the head. It is often felt over the front and sides of the head. Tension headaches can last from 30 minutes to several days. What are the causes? The cause of this condition is not known. Sometimes, tension headaches are brought on by stress, worry (anxiety), or depression. Other things that may set them off include: Alcohol. Too much caffeine or caffeine withdrawal. Colds, flu, or sinus infections. Dental problems. This can include clenching your teeth. Being tired. Holding your head and neck in the same position for a long time, such as while using a computer. Smoking. Arthritis in the neck. What are the signs or symptoms? Feeling pressure around the head. A dull ache in the head. Pain over the front and sides of the head. Feeling sore or tender in the muscles of the head, neck, and shoulders. How is this treated? This condition may be treated with lifestyle changes and with medicines that help relieve symptoms. Follow these instructions at home: Managing pain Take over-the-counter and prescription medicines only as told by your doctor. When you have a headache, lie down in a dark, quiet room. If told, put ice on your head and neck. To do this: Put ice in a plastic bag. Place a towel between your skin and the bag. Leave the ice on for 20 minutes, 2-3 times a day. Take off the ice if your skin turns bright red. This is very important. If you cannot feel pain, heat, or cold, you have a greater risk of damage to the area. If told, put heat on the back of your neck. Do this as often as told by your doctor. Use the heat source that your doctor recommends, such as a moist heat pack or a heating pad. Place a towel between your skin and the heat source. Leave the heat on for 20-30 minutes. Take off the heat if your skin turns bright red. This is very important. If you cannot feel pain, heat, or cold, you  have a greater risk of getting burned. Eating and drinking Eat meals on a regular schedule. If you drink alcohol: Limit how much you have to: 0-1 drink a day for women who are not pregnant. 0-2 drinks a day for men. Know how much alcohol is in your drink. In the [place] one drink equals one 12 oz bottle of beer (355 mL), one 5 oz glass of wine (148 mL), or one 1 oz glass of hard liquor (44 mL). Drink enough fluid to keep your pee (urine) pale yellow. Do not use a lot of caffeine, or stop using caffeine. Lifestyle Get 7-9 hours of sleep each night. Or get the amount of sleep that your doctor tells you to. At bedtime, keep computers, phones, and tablets out of your room. Find ways to lessen your stress. This may include: Exercise. Deep breathing. Yoga. Listening to music. Thinking positive thoughts. Sit up straight. Try to relax your muscles. Do not smoke or use any products that contain nicotine or tobacco. If you need help quitting, ask your doctor. General instructions  Avoid things that can bring on headaches. Keep a headache journal to see what may bring on headaches. For example, write down: What you eat and drink. How much sleep you get. Any change to your diet or medicines. Keep all follow-up visits. Contact a doctor if: Your headache does not get better. Your headache comes back. You have a headache, and sounds,   light, or smells bother you. You feel like you may vomit, or you vomit. Your stomach hurts. Get help right away if: You all of a sudden get a very bad headache with any of these things: A stiff neck. Feeling like you may vomit. Vomiting. Feeling mixed up (confused). Feeling weak in one part or one side of your body. Having trouble seeing or speaking, or both. Feeling short of breath. A rash. Feeling very sleepy. Pain in your eye or ear. Trouble walking or balancing. Feeling like you will faint, or you faint. Summary A tension headache is pain, pressure,  or aching in your head. Tension headaches can last from 30 minutes to several days. Lifestyle changes and medicines may help relieve pain. This information is not intended to replace advice given to you by your health care provider. Make sure you discuss any questions you have with your health care provider. Document Revised: 06/29/2020 Document Reviewed: 06/29/2020 Elsevier Patient Education  2023 Elsevier Inc. Generalized Anxiety Disorder, Adult Generalized anxiety disorder (GAD) is a mental health condition. Unlike normal worries, anxiety related to GAD is not triggered by a specific event. These worries do not fade or get better with time. GAD interferes with relationships, work, and school. GAD symptoms can vary from mild to severe. People with severe GAD can have intense waves of anxiety with physical symptoms that are similar to panic attacks. What are the causes? The exact cause of GAD is not known, but the following are believed to have an impact: Differences in natural brain chemicals. Genes passed down from parents to children. Differences in the way threats are perceived. Development and stress during childhood. Personality. What increases the risk? The following factors may make you more likely to develop this condition: Being female. Having a family history of anxiety disorders. Being very shy. Experiencing very stressful life events, such as the death of a loved one. Having a very stressful family environment. What are the signs or symptoms? People with GAD often worry excessively about many things in their lives, such as their health and family. Symptoms may also include: Mental and emotional symptoms: Worrying excessively about natural disasters. Fear of being late. Difficulty concentrating. Fears that others are judging your performance. Physical symptoms: Fatigue. Headaches, muscle tension, muscle twitches, trembling, or feeling shaky. Feeling like your heart is  pounding or beating very fast. Feeling out of breath or like you cannot take a deep breath. Having trouble falling asleep or staying asleep, or experiencing restlessness. Sweating. Nausea, diarrhea, or irritable bowel syndrome (IBS). Behavioral symptoms: Experiencing erratic moods or irritability. Avoidance of new situations. Avoidance of people. Extreme difficulty making decisions. How is this diagnosed? This condition is diagnosed based on your symptoms and medical history. You will also have a physical exam. Your health care provider may perform tests to rule out other possible causes of your symptoms. To be diagnosed with GAD, a person must have anxiety that: Is out of his or her control. Affects several different aspects of his or her life, such as work and relationships. Causes distress that makes him or her unable to take part in normal activities. Includes at least three symptoms of GAD, such as restlessness, fatigue, trouble concentrating, irritability, muscle tension, or sleep problems. Before your health care provider can confirm a diagnosis of GAD, these symptoms must be present more days than they are not, and they must last for 6 months or longer. How is this treated? This condition may be treated with: Medicine. Antidepressant medicine  is usually prescribed for long-term daily control. Anti-anxiety medicines may be added in severe cases, especially when panic attacks occur. Talk therapy (psychotherapy). Certain types of talk therapy can be helpful in treating GAD by providing support, education, and guidance. Options include: Cognitive behavioral therapy (CBT). People learn coping skills and self-calming techniques to ease their physical symptoms. They learn to identify unrealistic thoughts and behaviors and to replace them with more appropriate thoughts and behaviors. Acceptance and commitment therapy (ACT). This treatment teaches people how to be mindful as a way to cope with  unwanted thoughts and feelings. Biofeedback. This process trains you to manage your body's response (physiological response) through breathing techniques and relaxation methods. You will work with a therapist while machines are used to monitor your physical symptoms. Stress management techniques. These include yoga, meditation, and exercise. A mental health specialist can help determine which treatment is best for you. Some people see improvement with one type of therapy. However, other people require a combination of therapies. Follow these instructions at home: Lifestyle Maintain a consistent routine and schedule. Anticipate stressful situations. Create a plan and allow extra time to work with your plan. Practice stress management or self-calming techniques that you have learned from your therapist or your health care provider. Exercise regularly and spend time outdoors. Eat a healthy diet that includes plenty of vegetables, fruits, whole grains, low-fat dairy products, and lean protein. Do not eat a lot of foods that are high in fat, added sugar, or salt (sodium). Drink plenty of water. Avoid alcohol. Alcohol can increase anxiety. Avoid caffeine and certain over-the-counter cold medicines. These may make you feel worse. Ask your pharmacist which medicines to avoid. General instructions Take over-the-counter and prescription medicines only as told by your health care provider. Understand that you are likely to have setbacks. Accept this and be kind to yourself as you persist to take better care of yourself. Anticipate stressful situations. Create a plan and allow extra time to work with your plan. Recognize and accept your accomplishments, even if you judge them as small. Spend time with people who care about you. Keep all follow-up visits. This is important. Where to find more information [place]: https://carter.com/ Substance Abuse and Mental Health Services:  ktimeonline.com Contact a health care provider if: Your symptoms do not get better. Your symptoms get worse. You have signs of depression, such as: A persistently sad or irritable mood. Loss of enjoyment in activities that used to bring you joy. Change in weight or eating. Changes in sleeping habits. Get help right away if: You have thoughts about hurting yourself or others. If you ever feel like you may hurt yourself or others, or have thoughts about taking your own life, get help right away. Go to your nearest emergency department or: Call your local emergency services (911 in the [place] Call a suicide crisis helpline, such as the [employer] at 562 154 2592 or 988 in the [place]. This is open 24 hours a day in the [place] Text the Crisis Text Line at 4401242059 (in the [place].). Summary Generalized anxiety disorder (GAD) is a mental health condition that involves worry that is not triggered by a specific event. People with GAD often worry excessively about many things in their lives, such as their health and family. GAD may cause symptoms such as restlessness, trouble concentrating, sleep problems, frequent sweating, nausea, diarrhea, headaches, and trembling or muscle twitching. A mental health specialist can help determine which treatment is best for  you. Some people see improvement with one type of therapy. However, other people require a combination of therapies. This information is not intended to replace advice given to you by your health care provider. Make sure you discuss any questions you have with your health care provider. Document Revised: 04/25/2021 Document Reviewed: 01/21/2021 Elsevier Patient Education  2023 [employer].

## 2022-07-31 ENCOUNTER — Encounter: Payer: Self-pay | Admitting: Nurse Practitioner

## 2022-07-31 ENCOUNTER — Other Ambulatory Visit: Payer: Self-pay | Admitting: Nurse Practitioner

## 2022-07-31 NOTE — Telephone Encounter (Signed)
Your other options will be Nexplanon it's an implant, IUD, Depo shot (every 3 months)  or the oral birth control pill. It most depend on your lifestyle what will be easier to remember   All of them have a little side effect but the Depo comes along with weight gain for some people.   You just have to try to see what works best for you. The pill among these options is easier to start and stop.

## 2022-08-04 ENCOUNTER — Other Ambulatory Visit: Payer: Self-pay | Admitting: Nurse Practitioner

## 2022-08-04 DIAGNOSIS — Z309 Encounter for contraceptive management, unspecified: Secondary | ICD-10-CM

## 2022-08-04 NOTE — Telephone Encounter (Signed)
I completed a referral to set you up with a new OB. They will explore other helpful birth control with you.

## 2022-08-23 ENCOUNTER — Encounter: Payer: Self-pay | Admitting: Nurse Practitioner

## 2022-08-23 ENCOUNTER — Ambulatory Visit: Payer: Medicaid Other | Admitting: Nurse Practitioner

## 2022-08-23 VITALS — BP 132/83 | HR 92 | Temp 98.9°F | Ht 69.0 in | Wt 211.0 lb

## 2022-08-23 DIAGNOSIS — M25561 Pain in right knee: Secondary | ICD-10-CM | POA: Diagnosis not present

## 2022-08-23 MED ORDER — IBUPROFEN 600 MG PO TABS: 600.0000 mg | ORAL_TABLET | Freq: Three times a day (TID) | ORAL | 0 refills | Status: DC | PRN

## 2022-08-23 MED ORDER — METHOCARBAMOL 500 MG PO TABS: 500.0000 mg | ORAL_TABLET | Freq: Four times a day (QID) | ORAL | 1 refills | Status: DC

## 2022-08-23 NOTE — Progress Notes (Signed)
Acute Office Visit  Subjective:     Patient ID: Margaret Juarez, female    DOB: Feb 15, 1996, 26 y.o.   MRN: [id number]  Chief Complaint  Patient presents with   Knee Injury    Pt was playing softball 5 weeks ago , she states it hurts to walk , or even drive to put pressure on it     Knee Pain  The incident occurred 5 to 7 days ago. The incident occurred at the gym. The pain is at a severity of 8/10. The pain is severe. The pain has been Constant since onset. Pertinent negatives include no loss of motion or numbness. She reports no foreign bodies present. The symptoms are aggravated by movement and palpation. She has tried acetaminophen and NSAIDs for the symptoms. The treatment provided no relief.    Review of Systems  Constitutional: Negative.   HENT: Negative.  Negative for hearing loss.   Respiratory: Negative.    Cardiovascular: Negative.   Gastrointestinal: Negative.   Genitourinary: Negative.   Musculoskeletal:  Positive for joint pain.  Skin: Negative.  Negative for itching and rash.  Neurological: Negative.  Negative for numbness.  All other systems reviewed and are negative.       Objective:    BP 132/83   Pulse 92   Temp 98.9 F (37.2 C)   Ht 5\' 9"  (1.753 m)   Wt 211 lb (95.7 kg)   LMP 07/26/2022 (Within Weeks)   SpO2 98%   BMI 31.16 kg/m  BP Readings from Last 3 Encounters:  08/23/22 132/83  07/22/22 130/77  06/10/22 122/74   Wt Readings from Last 3 Encounters:  08/23/22 211 lb (95.7 kg)  07/22/22 212 lb 9.6 oz (96.4 kg)  06/10/22 205 lb (93 kg)      Physical Exam Vitals reviewed.  Constitutional:      Appearance: Normal appearance.  HENT:     Head: Normocephalic.     Right Ear: External ear normal.     Left Ear: External ear normal.     Nose: Nose normal.  Eyes:     Conjunctiva/sclera: Conjunctivae normal.  Cardiovascular:     Rate and Rhythm: Normal rate and regular rhythm.     Pulses: Normal pulses.  Pulmonary:     Effort:  Pulmonary effort is normal.     Breath sounds: Normal breath sounds.  Abdominal:     General: Bowel sounds are normal.  Musculoskeletal:     Right knee: Swelling present. Decreased range of motion. Tenderness present. No LCL laxity, MCL laxity or ACL laxity.  Skin:    General: Skin is warm.     Findings: No erythema or rash.  Neurological:     Mental Status: She is alert and oriented to person, place, and time.     No results found for any visits on 08/23/22.      Assessment & Plan:  Patient presents with right knee pain after twisting her leg at a softball game. Completed assessment.  Tenderness and pain when flexing knee forward. Keep knee joint stable with the brace, ibuprofen as needed for pain, Robaxin for musculoskeletal pain.  X-ray will be completed Monday. Follow-up with worsening unresolved symptoms. Problem List Items Addressed This Visit   None Visit Diagnoses     Acute pain of right knee    -  Primary   Relevant Medications   methocarbamol (ROBAXIN) 500 MG tablet   ibuprofen (ADVIL) 600 MG tablet   Other Relevant Orders  DG Knee 1-2 Views Right       Meds ordered this encounter  Medications   methocarbamol (ROBAXIN) 500 MG tablet    Sig: Take 1 tablet (500 mg total) by mouth 4 (four) times daily.    Dispense:  30 tablet    Refill:  1    Order Specific Question:   Supervising Provider    Answer:   Standley Brooking   ibuprofen (ADVIL) 600 MG tablet    Sig: Take 1 tablet (600 mg total) by mouth every 8 (eight) hours as needed.    Dispense:  30 tablet    Refill:  0    Order Specific Question:   Supervising Provider    Answer:   Mechele Claude [phone]    Return if symptoms worsen or fail to improve.  Daryll Drown, NP

## 2022-08-23 NOTE — Patient Instructions (Signed)
Acute Knee Pain, Adult Many things can cause knee pain. Sometimes, knee pain is sudden (acute) and may be caused by damage, swelling, or irritation of the muscles and tissues that support your knee. The pain often goes away on its own with time and rest. If the pain does not go away, tests may be done to find out what is causing the pain. Follow these instructions at home: If you have a knee sleeve or brace:  Wear the knee sleeve or brace as told by your doctor. Take it off only as told by your doctor. Loosen it if your toes: Tingle. Become numb. Turn cold and blue. Keep it clean. If the knee sleeve or brace is not waterproof: Do not let it get wet. Cover it with a watertight covering when you take a bath or shower. Activity Rest your knee. Do not do things that cause pain or make pain worse. Avoid activities where both feet leave the ground at the same time (high-impact activities). Examples are running, jumping rope, and doing jumping jacks. Work with a physical therapist to make a safe exercise program, as told by your doctor. Managing pain, stiffness, and swelling  If told, put ice on the knee. To do this: If you have a removable knee sleeve or brace, take it off as told by your doctor. Put ice in a plastic bag. Place a towel between your skin and the bag. Leave the ice on for 20 minutes, 2-3 times a day. Take off the ice if your skin turns bright red. This is very important. If you cannot feel pain, heat, or cold, you have a greater risk of damage to the area. If told, use an elastic bandage to put pressure (compression) on your injured knee. Raise your knee above the level of your heart while you are sitting or lying down. Sleep with a pillow under your knee. General instructions Take over-the-counter and prescription medicines only as told by your doctor. Do not smoke or use any products that contain nicotine or tobacco. If you need help quitting, ask your doctor. If you are  overweight, work with your doctor and a food expert (dietitian) to set goals to lose weight. Being overweight can make your knee hurt more. Watch for any changes in your symptoms. Keep all follow-up visits. Contact a doctor if: The knee pain does not stop. The knee pain changes or gets worse. You have a fever along with knee pain. Your knee is red or feels warm when you touch it. Your knee gives out or locks up. Get help right away if: Your knee swells, and the swelling gets worse. You cannot move your knee. You have very bad knee pain that does not get better with pain medicine. Summary Many things can cause knee pain. The pain often goes away on its own with time and rest. Your doctor may do tests to find out the cause of the pain. Watch for any changes in your symptoms. Relieve your pain with rest, medicines, light activity, and use of ice. Get help right away if you cannot move your knee or your knee pain is very bad. This information is not intended to replace advice given to you by your health care provider. Make sure you discuss any questions you have with your health care provider. Document Revised: 03/15/2020 Document Reviewed: 03/15/2020 Elsevier Patient Education  2023 Elsevier Inc.  

## 2022-08-26 ENCOUNTER — Other Ambulatory Visit (INDEPENDENT_AMBULATORY_CARE_PROVIDER_SITE_OTHER): Payer: Medicaid Other

## 2022-08-26 DIAGNOSIS — M25561 Pain in right knee: Secondary | ICD-10-CM | POA: Diagnosis not present

## 2022-09-04 ENCOUNTER — Ambulatory Visit: Payer: Medicaid Other | Admitting: Adult Health

## 2022-09-16 ENCOUNTER — Encounter: Payer: Self-pay | Admitting: *Deleted

## 2022-10-22 ENCOUNTER — Ambulatory Visit: Payer: Medicaid Other | Admitting: Nurse Practitioner

## 2022-10-23 ENCOUNTER — Ambulatory Visit: Payer: Medicaid Other | Admitting: Adult Health

## 2022-10-25 ENCOUNTER — Ambulatory Visit: Payer: Medicaid Other | Admitting: Nurse Practitioner

## 2022-10-25 ENCOUNTER — Encounter: Payer: Self-pay | Admitting: Nurse Practitioner

## 2022-10-25 DIAGNOSIS — F419 Anxiety disorder, unspecified: Secondary | ICD-10-CM | POA: Diagnosis not present

## 2022-10-25 MED ORDER — BUSPIRONE HCL 5 MG PO TABS: 5.0000 mg | ORAL_TABLET | Freq: Two times a day (BID) | ORAL | 1 refills | Status: DC

## 2022-10-25 MED ORDER — ONDANSETRON 4 MG PO TBDP: 4.0000 mg | ORAL_TABLET | Freq: Three times a day (TID) | ORAL | 1 refills | Status: DC | PRN

## 2022-10-25 NOTE — Progress Notes (Signed)
Established Patient Office Visit  Subjective   Patient ID: Margaret Juarez, female    DOB: 02/29/1996  Age: 27 y.o. MRN: [id number]  Chief Complaint  Patient presents with   Anxiety   Depression    HPI   Anxiety, Follow-up  She was last seen for anxiety 3 months ago. Changes made at last visit include Buspar 5 mg tablet by mouth daily.   She reports good compliance with treatment. She reports good tolerance of treatment. She is not having side effects.   She feels her anxiety is mild and Improved since last visit.  Symptoms: No chest pain No difficulty concentrating  No dizziness No fatigue  No feelings of losing control No insomnia  No irritable No palpitations  No panic attacks No racing thoughts  No shortness of breath No sweating  No tremors/shakes    GAD-7 Results    10/25/2022    2:54 PM 10/25/2022    2:52 PM 08/23/2022    2:34 PM  GAD-7 Generalized Anxiety Disorder Screening Tool  1. Feeling Nervous, Anxious, or on Edge 0 0 1  2. Not Being Able to Stop or Control Worrying 0 0 1  3. Worrying Too Much About Different Things 0 2 0  4. Trouble Relaxing 0 2 1  5. Being So Restless it's Hard To Sit Still 0 0 1  6. Becoming Easily Annoyed or Irritable 0 0 1  7. Feeling Afraid As If Something Awful Might Happen 0 0 1  Total GAD-7 Score 0 4 6  Difficulty At Work, Home, or Getting  Along With Others? Not difficult at all Somewhat difficult Somewhat difficult    PHQ-9 Scores    08/23/2022    2:33 PM 07/22/2022    2:11 PM 06/10/2022    2:04 PM  PHQ9 SCORE ONLY  PHQ-9 Total Score 4 6 9     Patient Active Problem List   Diagnosis Date Noted   Cyst of right ovary 06/10/2022   Anxiety 06/10/2022   Encounter for contraceptive management 06/10/2022   [redacted] weeks gestation of pregnancy 02/27/2021   Nonadherence to medical treatment 02/27/2021   Hyperthyroidism 03/19/2018   Graves' disease 03/19/2018   [redacted] weeks gestation of pregnancy 03/19/2018   Past Medical  History:  Diagnosis Date   Asthma    Chronic pansinusitis    Graves disease 2018   Von Willebrand disease ([place])    Past Surgical History:  Procedure Laterality Date   WISDOM TOOTH EXTRACTION     Social History   Tobacco Use   Smoking status: Never   Smokeless tobacco: Never  Vaping Use   Vaping Use: Never used  Substance Use Topics   Alcohol use: No   Social History   Socioeconomic History   Marital status: Single    Spouse name: Not on file   Number of children: Not on file   Years of education: Not on file   Highest education level: Not on file  Occupational History   Not on file  Tobacco Use   Smoking status: Never   Smokeless tobacco: Never  Vaping Use   Vaping Use: Never used  Substance and Sexual Activity   Alcohol use: No   Drug use: Not on file   Sexual activity: Yes    Birth control/protection: Inserts    Comment: NUVARING  Other Topics Concern   Not on file  Social History Narrative   Not on file   Social Determinants of Radio broadcast assistant  Strain: Not on file  Food Insecurity: Not on file  Transportation Needs: Not on file  Physical Activity: Not on file  Stress: Not on file  Social Connections: Not on file  Intimate Partner Violence: Not on file   Family Status  Relation Name Status   Mother  Alive   Father  Alive   Ethlyn Daniels  Alive   Family History  Problem Relation Age of Onset   Osteoarthritis Mother    Diabetes Father    Heart disease Father    Osteoarthritis Father    Osteoarthritis Paternal Aunt    Breast cancer Paternal Aunt    Allergies  Allergen Reactions   Mangifera Indica Anaphylaxis   Mango Butter    Cephalexin Hives      Review of Systems  Constitutional: Negative.   HENT: Negative.    Respiratory: Negative.    Cardiovascular: Negative.   Genitourinary: Negative.   Musculoskeletal: Negative.   Skin: Negative.   Psychiatric/Behavioral:  Negative for depression.   All other systems reviewed and are  negative.     Objective:     BP 120/71   Pulse 100   Temp 98.8 F (37.1 C)   Ht 5\' 9"  (1.753 m)   Wt 210 lb (95.3 kg)   SpO2 96%   BMI 31.01 kg/m  Wt Readings from Last 3 Encounters:  10/25/22 210 lb (95.3 kg)  08/23/22 211 lb (95.7 kg)  07/22/22 212 lb 9.6 oz (96.4 kg)      Physical Exam Vitals and nursing note reviewed.  Constitutional:      Appearance: Normal appearance.  HENT:     Right Ear: External ear normal.     Left Ear: External ear normal.     Nose: Nose normal.     Mouth/Throat:     Pharynx: Oropharynx is clear.  Eyes:     Conjunctiva/sclera: Conjunctivae normal.  Cardiovascular:     Rate and Rhythm: Normal rate and regular rhythm.     Pulses: Normal pulses.     Heart sounds: Normal heart sounds.  Pulmonary:     Effort: Pulmonary effort is normal.     Breath sounds: Normal breath sounds.  Abdominal:     General: Bowel sounds are normal.  Skin:    Findings: No erythema or rash.  Neurological:     General: No focal deficit present.     Mental Status: She is alert and oriented to person, place, and time.  Psychiatric:        Mood and Affect: Mood normal.        Behavior: Behavior normal.      No results found for any visits on 10/25/22.  Last CBC No results found for: "WBC", "HGB", "HCT", "MCV", "MCH", "RDW", "PLT" Last metabolic panel Lab Results  Component Value Date   GLUCOSE 92 03/19/2018   NA 137 03/19/2018   K 4.0 03/19/2018   CL 104 03/19/2018   CO2 24 03/19/2018   BUN 9 03/19/2018   CREATININE 0.46 (L) 03/19/2018   GFRNONAA 142 03/19/2018   CALCIUM 8.8 03/19/2018   PROT 7.1 03/19/2018   PROT 7.1 03/19/2018   BILITOT 0.2 03/19/2018   BILITOT 0.2 03/19/2018   AST 10 03/19/2018   AST 10 03/19/2018   ALT 9 03/19/2018   ALT 9 03/19/2018      The ASCVD Risk score (Arnett DK, et al., 2019) failed to calculate for the following reasons:   The 2019 ASCVD risk score is only valid  for ages 56 to 32    Assessment & Plan:    Problem List Items Addressed This Visit       Other   Anxiety    Patients anxiety is well controlled on buspar 5 mg tablet by mouth, continue current dose. RX refill sent to pharmacy, follow up in 6  months or as needed      Relevant Medications   busPIRone (BUSPAR) 5 MG tablet   ondansetron (ZOFRAN-ODT) 4 MG disintegrating tablet    Return in about 6 months (around 04/25/2023) for Anxiety.    Daryll Drown, NP

## 2022-10-25 NOTE — Assessment & Plan Note (Signed)
Patients anxiety is well controlled on buspar 5 mg tablet by mouth, continue current dose. RX refill sent to pharmacy, follow up in 6  months or as needed

## 2022-10-25 NOTE — Patient Instructions (Signed)

## 2022-10-31 ENCOUNTER — Encounter: Payer: Self-pay | Admitting: Family Medicine

## 2022-10-31 ENCOUNTER — Ambulatory Visit: Payer: Medicaid Other | Admitting: Family Medicine

## 2022-10-31 VITALS — BP 124/79 | HR 85 | Temp 98.1°F | Ht 69.0 in | Wt 208.0 lb

## 2022-10-31 DIAGNOSIS — D509 Iron deficiency anemia, unspecified: Secondary | ICD-10-CM

## 2022-10-31 DIAGNOSIS — Z6830 Body mass index (BMI) 30.0-30.9, adult: Secondary | ICD-10-CM

## 2022-10-31 DIAGNOSIS — E559 Vitamin D deficiency, unspecified: Secondary | ICD-10-CM | POA: Diagnosis not present

## 2022-10-31 DIAGNOSIS — Z833 Family history of diabetes mellitus: Secondary | ICD-10-CM | POA: Diagnosis not present

## 2022-10-31 DIAGNOSIS — R5383 Other fatigue: Secondary | ICD-10-CM | POA: Diagnosis not present

## 2022-10-31 DIAGNOSIS — E0500 Thyrotoxicosis with diffuse goiter without thyrotoxic crisis or storm: Secondary | ICD-10-CM

## 2022-10-31 DIAGNOSIS — E6609 Other obesity due to excess calories: Secondary | ICD-10-CM | POA: Insufficient documentation

## 2022-10-31 LAB — BAYER DCA HB A1C WAIVED: HB A1C (BAYER DCA - WAIVED): 5.5 % (ref 4.8–5.6)

## 2022-10-31 NOTE — Progress Notes (Signed)
Established Patient Office Visit  Subjective   Patient ID: Margaret Juarez, female    DOB: Feb 29, 1996  Age: 27 y.o. MRN: [id number]  Chief Complaint  Patient presents with   Obesity    HPI Margaret Juarez reports fatigue for 3 weeks. She also has had irregular cycles for the last month. She also a reports decreased appetite. Denies changes in her weight, changes in bowel habits, edema, palpitations, or tremor. She has a history of graves disease but has not been on treatment for this for over a year as she has not followed up with endocrinology. She also has a history of vitamin D deficiency and iron deficiency anemia. She started taking an iron supplement for the last 2 weeks without improvement. She takes a daily MV.  She has been trying to lose weight but has not been successful. She eats a well balanced diet and exercises regularly.      10/31/2022    1:36 PM 08/23/2022    2:33 PM 07/22/2022    2:11 PM  Depression screen PHQ 2/9  Decreased Interest 0 1 1  Down, Depressed, Hopeless 0 1 0  PHQ - 2 Score 0 2 1  Altered sleeping 2 1 1   Tired, decreased energy 2 0 1  Change in appetite 0 0 1  Feeling bad or failure about yourself  0 1 1  Trouble concentrating 0 0 1  Moving slowly or fidgety/restless 0 0 0  Suicidal thoughts 0 0 0  PHQ-9 Score 4 4 6   Difficult doing work/chores Not difficult at all Somewhat difficult Somewhat difficult      10/31/2022    1:37 PM 10/25/2022    2:54 PM 10/25/2022    2:52 PM 08/23/2022    2:34 PM  GAD 7 : Generalized Anxiety Score  Nervous, Anxious, on Edge 0 0 0 1  Control/stop worrying 0 0 0 1  Worry too much - different things 0 0 2 0  Trouble relaxing 1 0 2 1  Restless 0 0 0 1  Easily annoyed or irritable 0 0 0 1  Afraid - awful might happen 0 0 0 1  Total GAD 7 Score 1 0 4 6  Anxiety Difficulty Not difficult at all Not difficult at all Somewhat difficult Somewhat difficult        ROS As per HPI.    Objective:     BP 124/79    Pulse 85   Temp 98.1 F (36.7 C) (Temporal)   Ht 5\' 9"  (1.753 m)   Wt 208 lb (94.3 kg)   SpO2 97%   BMI 30.72 kg/m    Physical Exam Vitals and nursing note reviewed.  Constitutional:      General: She is not in acute distress.    Appearance: She is not ill-appearing, toxic-appearing or diaphoretic.  Neck:     Thyroid: Thyromegaly present. No thyroid mass or thyroid tenderness.  Cardiovascular:     Rate and Rhythm: Normal rate and regular rhythm.     Heart sounds: Normal heart sounds. No murmur heard. Pulmonary:     Effort: Pulmonary effort is normal. No respiratory distress.     Breath sounds: Normal breath sounds.  Musculoskeletal:     Cervical back: Normal range of motion.     Right lower leg: No edema.     Left lower leg: No edema.  Lymphadenopathy:     Cervical: No cervical adenopathy.  Skin:    General: Skin is warm and dry.  Neurological:  General: No focal deficit present.     Mental Status: She is alert and oriented to person, place, and time.  Psychiatric:        Mood and Affect: Mood normal.        Behavior: Behavior normal.      No results found for any visits on 10/31/22.    The ASCVD Risk score (Arnett DK, et al., 2019) failed to calculate for the following reasons:   The 2019 ASCVD risk score is only valid for ages 16 to 76    Assessment & Plan:   [name] was seen today for obesity.  Diagnoses and all orders for this visit:  Other fatigue Will check labs as below. Will notify of results and plan of care pending results.  -     Anemia Profile B -     TSH -     VITAMIN D 25 Hydroxy (Vit-D Deficiency, Fractures)  Graves' disease Will check labs as below. Discussed likely referral back to endocrinology. She would like a referral to a [facility] location if possible.  -     TSH -     T3, Free -     T4, Free  Class 1 obesity due to excess calories without serious comorbidity with body mass index (BMI) of 30.0 to 30.9 in adult Discussed diet and  exercise. Labs pending. Discussed referral to nutrition, but she declined referral today. Discussed that medicaid does not cover weight loss medications.  -     CMP14+EGFR -     Bayer DCA Hb A1c Waived  Family history of diabetes mellitus Will check A1c today.  -     Bayer DCA Hb A1c Waived  Vitamin D deficiency Labs pending.  -     VITAMIN D 25 Hydroxy (Vit-D Deficiency, Fractures)  Iron deficiency anemia, unspecified iron deficiency anemia type Will check anemia panel. She started iron supplement 2 weeks ago.  -     Anemia Profile B  Return in about 6 months (around 05/01/2023) for CPE.   The patient indicates understanding of these issues and agrees with the plan.  Gwenlyn Perking, FNP

## 2022-11-01 ENCOUNTER — Telehealth: Payer: Self-pay

## 2022-11-01 ENCOUNTER — Other Ambulatory Visit: Payer: Self-pay | Admitting: Family Medicine

## 2022-11-01 ENCOUNTER — Other Ambulatory Visit: Payer: Self-pay | Admitting: *Deleted

## 2022-11-01 DIAGNOSIS — D509 Iron deficiency anemia, unspecified: Secondary | ICD-10-CM

## 2022-11-01 DIAGNOSIS — E559 Vitamin D deficiency, unspecified: Secondary | ICD-10-CM

## 2022-11-01 DIAGNOSIS — E0500 Thyrotoxicosis with diffuse goiter without thyrotoxic crisis or storm: Secondary | ICD-10-CM

## 2022-11-01 LAB — ANEMIA PROFILE B
Basophils Absolute: 0.1 10*3/uL (ref 0.0–0.2)
Basos: 1 %
EOS (ABSOLUTE): 0.1 10*3/uL (ref 0.0–0.4)
Eos: 2 %
Ferritin: 4 ng/mL — ABNORMAL LOW (ref 15–150)
Folate: 7.2 ng/mL (ref >3.0)
Hematocrit: 28.7 % — ABNORMAL LOW (ref 34.0–46.6)
Hemoglobin: 7.9 g/dL — CL (ref 11.1–15.9)
Immature Grans (Abs): 0 10*3/uL (ref 0.0–0.1)
Immature Granulocytes: 0 %
Iron Saturation: 3 % — CL (ref 15–55)
Iron: 17 ug/dL — ABNORMAL LOW (ref 27–159)
Lymphocytes Absolute: 1.7 10*3/uL (ref 0.7–3.1)
Lymphs: 24 %
MCH: 15.3 pg — ABNORMAL LOW (ref 26.6–33.0)
MCHC: 27.5 g/dL — ABNORMAL LOW (ref 31.5–35.7)
MCV: 55 fL — ABNORMAL LOW (ref 79–97)
Monocytes Absolute: 0.6 10*3/uL (ref 0.1–0.9)
Monocytes: 9 %
Neutrophils Absolute: 4.3 10*3/uL (ref 1.4–7.0)
Neutrophils: 64 %
Platelets: 407 10*3/uL (ref 150–450)
RBC: 5.18 x10E6/uL (ref 3.77–5.28)
RDW: 18.9 % — ABNORMAL HIGH (ref 11.7–15.4)
Retic Ct Pct: 1.4 % (ref 0.6–2.6)
Total Iron Binding Capacity: 526 ug/dL — ABNORMAL HIGH (ref 250–450)
UIBC: 509 ug/dL — ABNORMAL HIGH (ref 131–425)
Vitamin B-12: 768 pg/mL (ref 232–1245)
WBC: 6.8 10*3/uL (ref 3.4–10.8)

## 2022-11-01 LAB — TSH: TSH: <0.005 u[IU]/mL — ABNORMAL LOW (ref 0.450–4.500)

## 2022-11-01 LAB — CMP14+EGFR
ALT: 22 IU/L (ref 0–32)
AST: 24 IU/L (ref 0–40)
Albumin/Globulin Ratio: 1.3 (ref 1.2–2.2)
Albumin: 4.2 g/dL (ref 4.0–5.0)
Alkaline Phosphatase: 97 IU/L (ref 44–121)
BUN/Creatinine Ratio: 20 (ref 9–23)
BUN: 11 mg/dL (ref 6–20)
Bilirubin Total: 0.3 mg/dL (ref 0.0–1.2)
CO2: 20 mmol/L (ref 20–29)
Calcium: 10.2 mg/dL (ref 8.7–10.2)
Chloride: 104 mmol/L (ref 96–106)
Creatinine, Ser: 0.56 mg/dL — ABNORMAL LOW (ref 0.57–1.00)
Globulin, Total: 3.2 g/dL (ref 1.5–4.5)
Glucose: 88 mg/dL (ref 70–99)
Potassium: 4.3 mmol/L (ref 3.5–5.2)
Sodium: 140 mmol/L (ref 134–144)
Total Protein: 7.4 g/dL (ref 6.0–8.5)
eGFR: 129 mL/min/{1.73_m2} (ref >59)

## 2022-11-01 LAB — T3, FREE: T3, Free: 7.4 pg/mL — ABNORMAL HIGH (ref 2.0–4.4)

## 2022-11-01 LAB — T4, FREE: Free T4: 2.6 ng/dL — ABNORMAL HIGH (ref 0.82–1.77)

## 2022-11-01 LAB — VITAMIN D 25 HYDROXY (VIT D DEFICIENCY, FRACTURES): Vit D, 25-Hydroxy: 18.4 ng/mL — ABNORMAL LOW (ref 30.0–100.0)

## 2022-11-01 MED ORDER — VITAMIN D (ERGOCALCIFEROL) 1.25 MG (50000 UNIT) PO CAPS: 50000.0000 [IU] | ORAL_CAPSULE | ORAL | 0 refills | Status: DC

## 2022-11-01 NOTE — Telephone Encounter (Signed)
Will notify patient and review other labs.

## 2022-11-01 NOTE — Telephone Encounter (Signed)
[facility] calling to let you know patient has a hemoglobin level of 7.9

## 2022-11-05 ENCOUNTER — Inpatient Hospital Stay: Payer: Medicaid Other | Attending: Hematology | Admitting: Hematology

## 2022-11-05 NOTE — Progress Notes (Deleted)
[place], [place] [id number]   CLINIC:  Medical Oncology/Hematology  CONSULT NOTE  Patient Care Team: Gwenlyn Perking, FNP as PCP - General (Family Medicine)  CHIEF COMPLAINTS/PURPOSE OF CONSULTATION:  ***   HISTORY OF PRESENTING ILLNESS:   Margaret Juarez 27 y.o. female is here at the request of ***  ***  PMH: ***  SOCIAL: ***  FAMILY: ***  MEDICAL HISTORY:  Past Medical History:  Diagnosis Date   Asthma    Chronic pansinusitis    Graves disease 2018   Von Willebrand disease ([place])     SURGICAL HISTORY: Past Surgical History:  Procedure Laterality Date   WISDOM TOOTH EXTRACTION      SOCIAL HISTORY: Social History   Socioeconomic History   Marital status: Single    Spouse name: Not on file   Number of children: Not on file   Years of education: Not on file   Highest education level: Not on file  Occupational History   Not on file  Tobacco Use   Smoking status: Never   Smokeless tobacco: Never  Vaping Use   Vaping Use: Never used  Substance and Sexual Activity   Alcohol use: No   Drug use: Not on file   Sexual activity: Yes    Birth control/protection: Inserts    Comment: NUVARING  Other Topics Concern   Not on file  Social History Narrative   Not on file   Social Determinants of Health   Financial Resource Strain: Not on file  Food Insecurity: Not on file  Transportation Needs: Not on file  Physical Activity: Not on file  Stress: Not on file  Social Connections: Not on file  Intimate Partner Violence: Not on file    FAMILY HISTORY: Family History  Problem Relation Age of Onset   Osteoarthritis Mother    Diabetes Father    Heart disease Father    Osteoarthritis Father    Osteoarthritis Paternal Aunt    Breast cancer Paternal Aunt     ALLERGIES:  is allergic to mangifera indica, mango butter, and cephalexin.  MEDICATIONS:  Current Outpatient Medications  Medication Sig Dispense  Refill   etonogestrel-ethinyl estradiol (NUVARING) 0.12-0.015 MG/24HR vaginal ring Insert vaginally and leave in place for 3 consecutive weeks, then remove for 1 week. 3 each 12   ibuprofen (ADVIL) 600 MG tablet Take 1 tablet (600 mg total) by mouth every 8 (eight) hours as needed. 30 tablet 0   ondansetron (ZOFRAN-ODT) 4 MG disintegrating tablet Take 1 tablet (4 mg total) by mouth every 8 (eight) hours as needed. 20 tablet 1   Vitamin D, Ergocalciferol, (DRISDOL) 1.25 MG (50000 UNIT) CAPS capsule Take 1 capsule (50,000 Units total) by mouth every 7 (seven) days. 12 capsule 0   No current facility-administered medications for this visit.    REVIEW OF SYSTEMS:  ***  Review of Systems - Oncology    PHYSICAL EXAMINATION:   ECOG PERFORMANCE STATUS: {CHL ONC ECOG [id number] *** There were no vitals filed for this visit. There were no vitals filed for this visit.  Physical Exam    LABORATORY DATA:  I have reviewed the data as listed Recent Results (from the past 2160 hour(s))  Bayer DCA Hb A1c Waived     Status: None   Collection Time: 10/31/22  1:56 PM  Result Value Ref Range   HB A1C (BAYER DCA - WAIVED) 5.5 4.8 - 5.6 %    Comment:  Prediabetes: 5.7 - 6.4          Diabetes: >6.4          Glycemic control for adults with diabetes: <7.0   Anemia Profile B     Status: Abnormal   Collection Time: 10/31/22  1:58 PM  Result Value Ref Range   Total Iron Binding Capacity 526 (H) 250 - 450 ug/dL   UIBC 509 (H) 131 - 425 ug/dL   Iron 17 (L) 27 - 159 ug/dL   Iron Saturation 3 (LL) 15 - 55 %   Ferritin 4 (L) 15 - 150 ng/mL   Vitamin B-12 768 232 - 1,245 pg/mL   Folate 7.2 >3.0 ng/mL    Comment: A serum folate concentration of less than 3.1 ng/mL is considered to represent clinical deficiency.    WBC 6.8 3.4 - 10.8 x10E3/uL   RBC 5.18 3.77 - 5.28 x10E6/uL    Comment: Polychromasia present   Hemoglobin 7.9 (LL) 11.1 - 15.9 g/dL    Comment:                   Client Requested  Flag   Hematocrit 28.7 (L) 34.0 - 46.6 %   MCV 55 (L) 79 - 97 fL   MCH 15.3 (L) 26.6 - 33.0 pg   MCHC 27.5 (L) 31.5 - 35.7 g/dL   RDW 18.9 (H) 11.7 - 15.4 %   Platelets 407 150 - 450 x10E3/uL    Comment: Platelet count verified by examination of peripheral blood smear.   Neutrophils 64 Not Estab. %   Lymphs 24 Not Estab. %   Monocytes 9 Not Estab. %   Eos 2 Not Estab. %   Basos 1 Not Estab. %   Neutrophils Absolute 4.3 1.4 - 7.0 x10E3/uL   Lymphocytes Absolute 1.7 0.7 - 3.1 x10E3/uL   Monocytes Absolute 0.6 0.1 - 0.9 x10E3/uL   EOS (ABSOLUTE) 0.1 0.0 - 0.4 x10E3/uL   Basophils Absolute 0.1 0.0 - 0.2 x10E3/uL   Immature Granulocytes 0 Not Estab. %   Immature Grans (Abs) 0.0 0.0 - 0.1 x10E3/uL   Hematology Comments: Note:     Comment: Verified by microscopic examination.   Retic Ct Pct 1.4 0.6 - 2.6 %  TSH     Status: Abnormal   Collection Time: 10/31/22  1:58 PM  Result Value Ref Range   TSH <0.005 (L) 0.450 - 4.500 uIU/mL  T3, Free     Status: Abnormal   Collection Time: 10/31/22  1:58 PM  Result Value Ref Range   T3, Free 7.4 (H) 2.0 - 4.4 pg/mL  T4, Free     Status: Abnormal   Collection Time: 10/31/22  1:58 PM  Result Value Ref Range   Free T4 2.60 (H) 0.82 - 1.77 ng/dL  CMP14+EGFR     Status: Abnormal   Collection Time: 10/31/22  1:58 PM  Result Value Ref Range   Glucose 88 70 - 99 mg/dL   BUN 11 6 - 20 mg/dL   Creatinine, Ser 0.56 (L) 0.57 - 1.00 mg/dL   eGFR 129 >59 mL/min/1.73   BUN/Creatinine Ratio 20 9 - 23   Sodium 140 134 - 144 mmol/L   Potassium 4.3 3.5 - 5.2 mmol/L   Chloride 104 96 - 106 mmol/L   CO2 20 20 - 29 mmol/L   Calcium 10.2 8.7 - 10.2 mg/dL   Total Protein 7.4 6.0 - 8.5 g/dL   Albumin 4.2 4.0 - 5.0 g/dL   Globulin, Total  3.2 1.5 - 4.5 g/dL   Albumin/Globulin Ratio 1.3 1.2 - 2.2   Bilirubin Total 0.3 0.0 - 1.2 mg/dL   Alkaline Phosphatase 97 44 - 121 IU/L   AST 24 0 - 40 IU/L   ALT 22 0 - 32 IU/L  VITAMIN D 25 Hydroxy (Vit-D Deficiency,  Fractures)     Status: Abnormal   Collection Time: 10/31/22  1:58 PM  Result Value Ref Range   Vit D, 25-Hydroxy 18.4 (L) 30.0 - 100.0 ng/mL    Comment: Vitamin D deficiency has been defined by the [place] and an Endocrine Society practice guideline as a level of serum 25-OH vitamin D less than 20 ng/mL (1,2). The Endocrine Society went on to further define vitamin D insufficiency as a level between 21 and 29 ng/mL (2). 1. IOM (Institute of Medicine). 2010. Dietary reference    intakes for calcium and D. [place]: The    [employer]. 2. Holick MF, Binkley [place], Bischoff-Ferrari HA, et al.    Evaluation, treatment, and prevention of vitamin D    deficiency: an Endocrine Society clinical practice    guideline. JCEM. 2011 Jul; 96(7):1911-30.     RADIOGRAPHIC STUDIES: I have personally reviewed the radiological images as listed and agreed with the findings in the report. No results found.   ASSESSMENT & PLAN:  1.  *** - *** - PLAN: ***   ***.  Other history - ***   PLAN SUMMARY: >> *** >> *** >> ***   All questions were answered. The patient knows to call the clinic with any problems, questions or concerns.  Medical decision making: ***  Time spent on visit: I spent *** minutes counseling the patient face to face. The total time spent in the appointment was *** minutes and more than 50% was on counseling.  I, Tarri Abernethy PA-C, have seen this patient in conjunction with Dr. Derek Jack.  Greater than 50% of visit was performed by Dr. Delton Coombes.   Harriett Rush, PA-C *** ***  DR. Delton CoombesMarland Kitchen

## 2022-11-29 ENCOUNTER — Encounter: Payer: Self-pay | Admitting: Family Medicine

## 2022-11-29 DIAGNOSIS — E0500 Thyrotoxicosis with diffuse goiter without thyrotoxic crisis or storm: Secondary | ICD-10-CM

## 2022-12-25 ENCOUNTER — Ambulatory Visit: Payer: Medicaid Other | Admitting: Family Medicine

## 2022-12-25 ENCOUNTER — Encounter: Payer: Self-pay | Admitting: Family Medicine

## 2023-03-03 ENCOUNTER — Telehealth: Payer: Medicaid Other | Admitting: Nurse Practitioner

## 2023-03-03 DIAGNOSIS — N898 Other specified noninflammatory disorders of vagina: Secondary | ICD-10-CM

## 2023-03-03 MED ORDER — FLUCONAZOLE 150 MG PO TABS: 150.0000 mg | ORAL_TABLET | Freq: Once | ORAL | 0 refills | Status: AC

## 2023-03-03 NOTE — Progress Notes (Signed)

## 2023-03-18 ENCOUNTER — Telehealth: Payer: Medicaid Other | Admitting: Nurse Practitioner

## 2023-03-18 DIAGNOSIS — J40 Bronchitis, not specified as acute or chronic: Secondary | ICD-10-CM

## 2023-03-18 DIAGNOSIS — J309 Allergic rhinitis, unspecified: Secondary | ICD-10-CM

## 2023-03-19 MED ORDER — AZITHROMYCIN 250 MG PO TABS: ORAL_TABLET | ORAL | 0 refills | Status: AC

## 2023-03-19 MED ORDER — ALBUTEROL SULFATE HFA 108 (90 BASE) MCG/ACT IN AERS: 2.0000 | INHALATION_SPRAY | Freq: Four times a day (QID) | RESPIRATORY_TRACT | 0 refills | Status: DC | PRN

## 2023-03-19 MED ORDER — FLUTICASONE PROPIONATE 50 MCG/ACT NA SUSP: 2.0000 | Freq: Every day | NASAL | 6 refills | Status: DC

## 2023-03-19 NOTE — Progress Notes (Signed)
E-Visit for Cough  We are sorry that you are not feeling well.  Here is how we plan to help!  Based on your presentation I believe you most likely have A cough due to bacteria.  When patients have a fever and a productive cough with a change in color or increased sputum production, we are concerned about bacterial bronchitis.  If left untreated it can progress to pneumonia.  If your symptoms do not improve with your treatment plan it is important that you contact your provider.   I have prescribed Azithromyin 250 mg: two tablets now and then one tablet daily for 4 additonal days    In addition you may use A non-prescription cough medication called Mucinex DM: take 2 tablets every 12 hours.  We will also prescribe an Albuterol Inhaler to help with your symptoms, and if you are not taking a daily allergy medication please start one like Zyrtec. We will also prescribe Flonase nasal spray   Meds ordered this encounter  Medications   azithromycin (ZITHROMAX) 250 MG tablet    Sig: Take 2 tablets on day 1, then 1 tablet daily on days 2 through 5    Dispense:  6 tablet    Refill:  0   albuterol (VENTOLIN HFA) 108 (90 Base) MCG/ACT inhaler    Sig: Inhale 2 puffs into the lungs every 6 (six) hours as needed for wheezing or shortness of breath.    Dispense:  8 g    Refill:  0   fluticasone (FLONASE) 50 MCG/ACT nasal spray    Sig: Place 2 sprays into both nostrils daily.    Dispense:  16 g    Refill:  6      From your responses in the eVisit questionnaire you describe inflammation in the upper respiratory tract which is causing a significant cough.  This is commonly called Bronchitis and has four common causes:   Allergies Viral Infections Acid Reflux Bacterial Infection Allergies, viruses and acid reflux are treated by controlling symptoms or eliminating the cause. An example might be a cough caused by taking certain blood pressure medications. You stop the cough by changing the medication.  Another example might be a cough caused by acid reflux. Controlling the reflux helps control the cough.  USE OF BRONCHODILATOR ("RESCUE") INHALERS: There is a risk from using your bronchodilator too frequently.  The risk is that over-reliance on a medication which only relaxes the muscles surrounding the breathing tubes can reduce the effectiveness of medications prescribed to reduce swelling and congestion of the tubes themselves.  Although you feel brief relief from the bronchodilator inhaler, your asthma may actually be worsening with the tubes becoming more swollen and filled with mucus.  This can delay other crucial treatments, such as oral steroid medications. If you need to use a bronchodilator inhaler daily, several times per day, you should discuss this with your provider.  There are probably better treatments that could be used to keep your asthma under control.     HOME CARE Only take medications as instructed by your medical team. Complete the entire course of an antibiotic. Drink plenty of fluids and get plenty of rest. Avoid close contacts especially the very young and the elderly Cover your mouth if you cough or cough into your sleeve. Always remember to wash your hands A steam or ultrasonic humidifier can help congestion.   GET HELP RIGHT AWAY IF: You develop worsening fever. You become short of breath You cough up blood. Your  symptoms persist after you have completed your treatment plan MAKE SURE YOU  Understand these instructions. Will watch your condition. Will get help right away if you are not doing well or get worse.    Thank you for choosing an e-visit.  Your e-visit answers were reviewed by a board certified advanced clinical practitioner to complete your personal care plan. Depending upon the condition, your plan could have included both over the counter or prescription medications.  Please review your pharmacy choice. Make sure the pharmacy is open so you can  pick up prescription now. If there is a problem, you may contact your provider through [employer] and have the prescription routed to another pharmacy.  Your safety is important to [place]. If you have drug allergies check your prescription carefully.   For the next 24 hours you can use MyChart to ask questions about today's visit, request a non-urgent call back, or ask for a work or school excuse. You will get an email in the next two days asking about your experience. I hope that your e-visit has been valuable and will speed your recovery.   I spent approximately 5 minutes reviewing the patient's history, current symptoms and coordinating their care today.

## 2023-05-05 ENCOUNTER — Encounter: Payer: Medicaid Other | Admitting: Family Medicine

## 2023-05-05 ENCOUNTER — Encounter: Payer: Self-pay | Admitting: Family Medicine

## 2023-05-09 DIAGNOSIS — J069 Acute upper respiratory infection, unspecified: Secondary | ICD-10-CM | POA: Diagnosis not present

## 2023-05-10 DIAGNOSIS — R051 Acute cough: Secondary | ICD-10-CM | POA: Diagnosis not present

## 2023-05-26 DIAGNOSIS — G8929 Other chronic pain: Secondary | ICD-10-CM | POA: Diagnosis not present

## 2023-05-26 DIAGNOSIS — M545 Low back pain, unspecified: Secondary | ICD-10-CM | POA: Diagnosis not present

## 2023-05-27 DIAGNOSIS — M9905 Segmental and somatic dysfunction of pelvic region: Secondary | ICD-10-CM | POA: Diagnosis not present

## 2023-05-27 DIAGNOSIS — M5386 Other specified dorsopathies, lumbar region: Secondary | ICD-10-CM | POA: Diagnosis not present

## 2023-05-27 DIAGNOSIS — M9903 Segmental and somatic dysfunction of lumbar region: Secondary | ICD-10-CM | POA: Diagnosis not present

## 2023-05-27 DIAGNOSIS — M9904 Segmental and somatic dysfunction of sacral region: Secondary | ICD-10-CM | POA: Diagnosis not present

## 2023-05-29 DIAGNOSIS — M9903 Segmental and somatic dysfunction of lumbar region: Secondary | ICD-10-CM | POA: Diagnosis not present

## 2023-05-29 DIAGNOSIS — M9905 Segmental and somatic dysfunction of pelvic region: Secondary | ICD-10-CM | POA: Diagnosis not present

## 2023-05-29 DIAGNOSIS — M9904 Segmental and somatic dysfunction of sacral region: Secondary | ICD-10-CM | POA: Diagnosis not present

## 2023-05-29 DIAGNOSIS — M5386 Other specified dorsopathies, lumbar region: Secondary | ICD-10-CM | POA: Diagnosis not present

## 2023-06-03 DIAGNOSIS — M9904 Segmental and somatic dysfunction of sacral region: Secondary | ICD-10-CM | POA: Diagnosis not present

## 2023-06-03 DIAGNOSIS — M5386 Other specified dorsopathies, lumbar region: Secondary | ICD-10-CM | POA: Diagnosis not present

## 2023-06-03 DIAGNOSIS — M9905 Segmental and somatic dysfunction of pelvic region: Secondary | ICD-10-CM | POA: Diagnosis not present

## 2023-06-03 DIAGNOSIS — M9903 Segmental and somatic dysfunction of lumbar region: Secondary | ICD-10-CM | POA: Diagnosis not present

## 2023-06-11 DIAGNOSIS — M9905 Segmental and somatic dysfunction of pelvic region: Secondary | ICD-10-CM | POA: Diagnosis not present

## 2023-06-11 DIAGNOSIS — M9904 Segmental and somatic dysfunction of sacral region: Secondary | ICD-10-CM | POA: Diagnosis not present

## 2023-06-11 DIAGNOSIS — M5386 Other specified dorsopathies, lumbar region: Secondary | ICD-10-CM | POA: Diagnosis not present

## 2023-06-11 DIAGNOSIS — M9903 Segmental and somatic dysfunction of lumbar region: Secondary | ICD-10-CM | POA: Diagnosis not present

## 2023-06-19 DIAGNOSIS — M9905 Segmental and somatic dysfunction of pelvic region: Secondary | ICD-10-CM | POA: Diagnosis not present

## 2023-06-19 DIAGNOSIS — M9903 Segmental and somatic dysfunction of lumbar region: Secondary | ICD-10-CM | POA: Diagnosis not present

## 2023-06-19 DIAGNOSIS — M5386 Other specified dorsopathies, lumbar region: Secondary | ICD-10-CM | POA: Diagnosis not present

## 2023-06-19 DIAGNOSIS — M9904 Segmental and somatic dysfunction of sacral region: Secondary | ICD-10-CM | POA: Diagnosis not present

## 2023-08-05 DIAGNOSIS — M5106 Intervertebral disc disorders with myelopathy, lumbar region: Secondary | ICD-10-CM | POA: Diagnosis not present

## 2023-08-05 DIAGNOSIS — M9905 Segmental and somatic dysfunction of pelvic region: Secondary | ICD-10-CM | POA: Diagnosis not present

## 2023-08-05 DIAGNOSIS — M9903 Segmental and somatic dysfunction of lumbar region: Secondary | ICD-10-CM | POA: Diagnosis not present

## 2023-08-05 DIAGNOSIS — M9904 Segmental and somatic dysfunction of sacral region: Secondary | ICD-10-CM | POA: Diagnosis not present

## 2023-08-06 DIAGNOSIS — R300 Dysuria: Secondary | ICD-10-CM | POA: Diagnosis not present

## 2023-09-16 ENCOUNTER — Ambulatory Visit: Payer: Medicaid Other | Admitting: Internal Medicine

## 2023-09-16 NOTE — Progress Notes (Unsigned)
Name: Margaret Juarez  MRN/ DOB: [id number], 1996/08/07    Age/ Sex: 27 y.o., female    PCP: Gabriel Earing, FNP   Reason for Endocrinology Evaluation: Luiz Blare' disease     Date of Initial Endocrinology Evaluation: 09/16/2023     HPI: [name] is a 27 y.o. female with a past medical history of hyperthyroidism. The patient presented for initial endocrinology clinic visit on 09/16/2023 for consultative assistance with her Luiz Blare' disease.   Patient was diagnosed with hyperthyroid secondary to Graves' disease during pregnancy in 2019, she was seen by Dr. Fransico Him at the time  Patient was lost to follow-up, until her return again to Dr. Isidoro Donning office in 2022 when she was pregnant at the time.  She was on PTU  HISTORY:  Past Medical History:  Past Medical History:  Diagnosis Date   Asthma    Chronic pansinusitis    Graves disease 2018   Von Willebrand disease (HCC)    Past Surgical History:  Past Surgical History:  Procedure Laterality Date   WISDOM TOOTH EXTRACTION      Social History:  reports that she has never smoked. She has never used smokeless tobacco.  Drug: Marijuana. She reports that she does not drink alcohol. Family History: family history includes Breast cancer in her paternal aunt; Diabetes in her father; Heart disease in her father; Osteoarthritis in her father, mother, and paternal aunt.   HOME MEDICATIONS: Allergies as of 09/16/2023       Reactions   Mangifera Indica Anaphylaxis   Mango Butter    Cephalexin Hives        Medication List        Accurate as of September 16, 2023  1:22 PM. If you have any questions, ask your nurse or doctor.          albuterol 108 (90 Base) MCG/ACT inhaler Commonly known as: VENTOLIN HFA Inhale 2 puffs into the lungs every 6 (six) hours as needed for wheezing or shortness of breath.   etonogestrel-ethinyl estradiol 0.12-0.015 MG/24HR vaginal ring Commonly known as: NuvaRing Insert vaginally and leave  in place for 3 consecutive weeks, then remove for 1 week.   fluticasone 50 MCG/ACT nasal spray Commonly known as: FLONASE Place 2 sprays into both nostrils daily.   ibuprofen 600 MG tablet Commonly known as: ADVIL Take 1 tablet (600 mg total) by mouth every 8 (eight) hours as needed.   ondansetron 4 MG disintegrating tablet Commonly known as: ZOFRAN-ODT Take 1 tablet (4 mg total) by mouth every 8 (eight) hours as needed.   Vitamin D (Ergocalciferol) 1.25 MG (50000 UNIT) Caps capsule Commonly known as: DRISDOL Take 1 capsule (50,000 Units total) by mouth every 7 (seven) days.          REVIEW OF SYSTEMS: A comprehensive ROS was conducted with the patient and is negative except as per HPI and below:  ROS     OBJECTIVE:  VS: There were no vitals taken for this visit.   Wt Readings from Last 3 Encounters:  10/31/22 208 lb (94.3 kg)  10/25/22 210 lb (95.3 kg)  08/23/22 211 lb (95.7 kg)     EXAM: General: Pt appears well and is in NAD  Eyes: External eye exam normal without stare, lid lag or exophthalmos.  EOM intact.  PERRL.  Neck: General: Supple without adenopathy. Thyroid: Thyroid size normal.  No goiter or nodules appreciated. No thyroid bruit.  Lungs: Clear with good BS bilat  Heart: Auscultation: RRR.  Abdomen: Soft, nontender  Extremities:  BL LE: No pretibial edema   Mental Status: Judgment, insight: Intact Orientation: Oriented to time, place, and person Mood and affect: No depression, anxiety, or agitation     DATA REVIEWED: ***   Thyroid Uptake 11/19/2016 FINDINGS: 24 hour radio iodine uptake is calculated at 71%, well above normal range consistent with hyperthyroidism.   Images of the thyroid gland in 3 projections demonstrate homogeneous diffusely increased tracer localization throughout both thyroid lobes.   No definite areas of increased or decreased tracer localization seen.   IMPRESSION: Significantly elevated 24 hour radio iodine  uptake of 71%.   Homogeneous increased tracer distribution throughout both thyroid lobes.   Findings consistent with Graves disease.    ASSESSMENT/PLAN/RECOMMENDATIONS:   Hyperthyroidism:    Medications :  Signed electronically by: Lyndle Herrlich, MD  [facility]  [facility] [address], [address] [place], [place] [address] Phone: [phone] FAX: [phone]   CC: Gabriel Earing, FNP [address] [place] [address] Phone: [phone] Fax: [phone]   Return to Endocrinology clinic as below: Future Appointments  Date Time Provider Department Center  09/16/2023  2:00 PM Aliea Bobe, Konrad Dolores, MD [id number] None

## 2023-09-30 DIAGNOSIS — M5106 Intervertebral disc disorders with myelopathy, lumbar region: Secondary | ICD-10-CM | POA: Diagnosis not present

## 2023-09-30 DIAGNOSIS — M9905 Segmental and somatic dysfunction of pelvic region: Secondary | ICD-10-CM | POA: Diagnosis not present

## 2023-09-30 DIAGNOSIS — M9904 Segmental and somatic dysfunction of sacral region: Secondary | ICD-10-CM | POA: Diagnosis not present

## 2023-09-30 DIAGNOSIS — M9903 Segmental and somatic dysfunction of lumbar region: Secondary | ICD-10-CM | POA: Diagnosis not present

## 2023-10-17 DIAGNOSIS — J020 Streptococcal pharyngitis: Secondary | ICD-10-CM | POA: Diagnosis not present

## 2023-10-29 DIAGNOSIS — M9904 Segmental and somatic dysfunction of sacral region: Secondary | ICD-10-CM | POA: Diagnosis not present

## 2023-10-29 DIAGNOSIS — M5386 Other specified dorsopathies, lumbar region: Secondary | ICD-10-CM | POA: Diagnosis not present

## 2023-10-29 DIAGNOSIS — M9905 Segmental and somatic dysfunction of pelvic region: Secondary | ICD-10-CM | POA: Diagnosis not present

## 2023-10-29 DIAGNOSIS — M9903 Segmental and somatic dysfunction of lumbar region: Secondary | ICD-10-CM | POA: Diagnosis not present

## 2023-11-03 DIAGNOSIS — M9903 Segmental and somatic dysfunction of lumbar region: Secondary | ICD-10-CM | POA: Diagnosis not present

## 2023-11-03 DIAGNOSIS — M5386 Other specified dorsopathies, lumbar region: Secondary | ICD-10-CM | POA: Diagnosis not present

## 2023-11-03 DIAGNOSIS — M9904 Segmental and somatic dysfunction of sacral region: Secondary | ICD-10-CM | POA: Diagnosis not present

## 2023-11-03 DIAGNOSIS — M9905 Segmental and somatic dysfunction of pelvic region: Secondary | ICD-10-CM | POA: Diagnosis not present

## 2023-11-04 DIAGNOSIS — N938 Other specified abnormal uterine and vaginal bleeding: Secondary | ICD-10-CM | POA: Diagnosis not present

## 2023-11-04 DIAGNOSIS — R1011 Right upper quadrant pain: Secondary | ICD-10-CM | POA: Diagnosis not present

## 2023-11-04 DIAGNOSIS — N939 Abnormal uterine and vaginal bleeding, unspecified: Secondary | ICD-10-CM | POA: Diagnosis not present

## 2023-11-04 DIAGNOSIS — K8050 Calculus of bile duct without cholangitis or cholecystitis without obstruction: Secondary | ICD-10-CM | POA: Diagnosis not present

## 2023-11-07 ENCOUNTER — Ambulatory Visit: Payer: Medicaid Other | Admitting: Family Medicine

## 2023-12-10 ENCOUNTER — Ambulatory Visit: Payer: Medicaid Other | Admitting: Internal Medicine

## 2023-12-10 ENCOUNTER — Encounter: Payer: Self-pay | Admitting: Internal Medicine

## 2023-12-10 VITALS — BP 140/82 | HR 114 | Ht 69.0 in | Wt 216.0 lb

## 2023-12-10 DIAGNOSIS — E0590 Thyrotoxicosis, unspecified without thyrotoxic crisis or storm: Secondary | ICD-10-CM | POA: Diagnosis not present

## 2023-12-10 DIAGNOSIS — E0500 Thyrotoxicosis with diffuse goiter without thyrotoxic crisis or storm: Secondary | ICD-10-CM | POA: Diagnosis not present

## 2023-12-10 MED ORDER — ATENOLOL 25 MG PO TABS: 25.0000 mg | ORAL_TABLET | Freq: Every day | ORAL | 3 refills | Status: DC

## 2023-12-10 NOTE — Progress Notes (Unsigned)
 Name: Margaret Juarez  MRN/ DOB: [id number], May 21, 1996    Age/ Sex: 28 y.o., female    PCP: Gabriel Earing, FNP   Reason for Endocrinology Evaluation: Hyperthyroidism     Date of Initial Endocrinology Evaluation: 12/10/2023     HPI: [name] is a 28 y.o. female with a past medical history of Hyperthyroidism. The patient presented for initial endocrinology clinic visit on 12/10/2023 for consultative assistance with her Hyperthyroidism.     She was diagnosed with hyperthyroidism in 2016 during evaluation for palpitations    The patient was noted with a suppressed TSH of <0.01 uIU/mL in 2019 with normal free T3 and free T4.  She was pregnant at the time  Patient had overt hyperthyroidism in 2022, with a suppressed TSH 0.005 uIU/mL, elevated free T4 at 2.61 IU/mL, elevated free T3 at 7.5 pg/mL  Of note, patient had elevated anti-TPO antibodies at 105 IU/mL as well as thyroglobulin antibodies 02/2021.  Thyroid uptake and scan 11/18/2016 showed significantly elevated 24-hour radioiodine uptake of 71%, findings consistent with Graves' disease  The patient established with Dr. Fransico Him [facility] endocrinology) in 2019, she was lost to follow-up from 2022 until establishing care at our clinic in 2025  The patient was prescribed methimazole at some point   She was recently seen by [facility] urgent care for dysfunctional uterine bleed  She has not been on Methimazole for years   Has been noted with weight gain  Has occasional local neck swelling  Has been having low energy and fatigue  Denies constipation or diarrhea  Continues with tremors  Continues with palpitations   Unable to tolerate COC's due to GI side effects  Follows with Gynecology - pending appointment next month  LMP: 2 weeks ago  Uses condoms  Has burning and itching on the eyes  Has anxiety   No Fh of thyroid disease   HISTORY:  Past Medical History:  Past Medical History:  Diagnosis Date    Asthma    Chronic pansinusitis    Graves disease 2018   Von Willebrand disease (HCC)    Past Surgical History:  Past Surgical History:  Procedure Laterality Date   WISDOM TOOTH EXTRACTION      Social History:  reports that she has never smoked. She has never used smokeless tobacco.  Drug: Marijuana. She reports that she does not drink alcohol. Family History: family history includes Breast cancer in her paternal aunt; Diabetes in her father; Heart disease in her father; Osteoarthritis in her father, mother, and paternal aunt.   HOME MEDICATIONS: Allergies as of 12/10/2023       Reactions   Mangifera Indica Anaphylaxis   Mango Butter    Cephalexin Hives        Medication List        Accurate as of December 10, 2023 10:02 AM. If you have any questions, ask your nurse or doctor.          albuterol 108 (90 Base) MCG/ACT inhaler Commonly known as: VENTOLIN HFA Inhale 2 puffs into the lungs every 6 (six) hours as needed for wheezing or shortness of breath.   Ella 30 MG tablet Generic drug: ulipristal acetate TAKE 1 TAB BY MOUTH AS A SINGLE DOSE WITHIN 5 DAYS (120 HOURS) OF UNPROTECTED SEX   etonogestrel-ethinyl estradiol 0.12-0.015 MG/24HR vaginal ring Commonly known as: NuvaRing Insert vaginally and leave in place for 3 consecutive weeks, then remove for 1 week.   fluticasone 50 MCG/ACT nasal  spray Commonly known as: FLONASE Place 2 sprays into both nostrils daily.   ibuprofen 600 MG tablet Commonly known as: ADVIL Take 1 tablet (600 mg total) by mouth every 8 (eight) hours as needed.   Loryna 3-0.02 MG tablet Generic drug: drospirenone-ethinyl estradiol Take 1 tablet by mouth daily.   ondansetron 4 MG disintegrating tablet Commonly known as: ZOFRAN-ODT Take 1 tablet (4 mg total) by mouth every 8 (eight) hours as needed.   Sodium Fluoride 5000 PPM 1.1 % Pste Generic drug: Sodium Fluoride PLEASE SEE ATTACHED FOR DETAILED DIRECTIONS   Vitamin D  (Ergocalciferol) 1.25 MG (50000 UNIT) Caps capsule Commonly known as: DRISDOL Take 1 capsule (50,000 Units total) by mouth every 7 (seven) days.          REVIEW OF SYSTEMS: A comprehensive ROS was conducted with the patient and is negative except as per HPI     OBJECTIVE:  VS: BP (!) 140/82 (BP Location: Right Arm, Patient Position: Sitting, Cuff Size: Normal)   Pulse (!) 114   Ht 5\' 9"  (1.753 m)   Wt 216 lb (98 kg)   SpO2 94%   BMI 31.90 kg/m    Wt Readings from Last 3 Encounters:  12/10/23 216 lb (98 kg)  10/31/22 208 lb (94.3 kg)  10/25/22 210 lb (95.3 kg)     EXAM: General: Pt appears well and is in NAD  Eyes: External eye exam normal without stare, lid lag or exophthalmos.  EOM intact.  PERRL.  Neck: General: Supple without adenopathy. Thyroid: Thyroid size normal.  No goiter or nodules appreciated. No thyroid bruit.  Lungs: Clear with good BS bilat   Heart: Auscultation: RRR.  Abdomen: Soft, nontender  Extremities:  BL LE: No pretibial edema   Mental Status: Judgment, insight: Intact Orientation: Oriented to time, place, and person Mood and affect: No depression, anxiety, or agitation     DATA REVIEWED: ***    Thyroid Uptake and Scan 11/19/2016 FINDINGS: 24 hour radio iodine uptake is calculated at 71%, well above normal range consistent with hyperthyroidism.   Images of the thyroid gland in 3 projections demonstrate homogeneous diffusely increased tracer localization throughout both thyroid lobes.   No definite areas of increased or decreased tracer localization seen.   IMPRESSION: Significantly elevated 24 hour radio iodine uptake of 71%.   Homogeneous increased tracer distribution throughout both thyroid lobes.   Findings consistent with Graves disease.      ASSESSMENT/PLAN/RECOMMENDATIONS:   Hyperthyroidism  -Patient is clinically euthyroid -Has occasional local neck swelling -Has been on and off methimazole over the years We  discussed that Graves' Disease is a result of an autoimmune condition involving the thyroid.   -We extensively discussed the various treatment options for hyperthyroidism and Graves disease including ablation therapy with radioactive iodine versus antithyroid drug treatment versus surgical therapy.  We recommended to the patient that we felt, at this time, that thionamide therapy would be most optimal.  We discussed the various possible benefits versus side effects of the various therapies.   - I carefully explained to the patient that one of the consequences of I-131 ablation treatment would likely be permanent hypothyroidism which would require long-term replacement therapy with LT4.   Medications : Start Atenolol 25 mg at bedtime Methimazole     2. Graves' Disease:  -Patient with burning and itching of the eyes   Follow-up in 3 months  Signed electronically by: Lyndle Herrlich, MD  [facility]  [facility] [address] [place], [place] [address] Phone: [phone] FAX: [phone]   CC: Gabriel Earing, FNP [address] [place] [address] Phone: [phone] Fax: [phone]   Return to Endocrinology clinic as below: Future Appointments  Date Time Provider Department Center  01/05/2024 10:00 AM Gabriel Earing, FNP [facility] None

## 2023-12-11 ENCOUNTER — Encounter: Payer: Self-pay | Admitting: Internal Medicine

## 2023-12-11 MED ORDER — METHIMAZOLE 5 MG PO TABS: 5.0000 mg | ORAL_TABLET | Freq: Every day | ORAL | 2 refills | Status: DC

## 2023-12-12 LAB — TRAB (TSH RECEPTOR BINDING ANTIBODY): TRAB: 2.46 IU/L — ABNORMAL HIGH (ref <=2.00)

## 2023-12-12 LAB — T3, FREE: T3, Free: 4 pg/mL (ref 2.3–4.2)

## 2023-12-12 LAB — T4, FREE: Free T4: 1.5 ng/dL (ref 0.8–1.8)

## 2023-12-12 LAB — TSH: TSH: 0.01 m[IU]/L — ABNORMAL LOW

## 2024-01-05 ENCOUNTER — Encounter: Payer: Self-pay | Admitting: Family Medicine

## 2024-01-05 ENCOUNTER — Ambulatory Visit (INDEPENDENT_AMBULATORY_CARE_PROVIDER_SITE_OTHER): Payer: Medicaid Other | Admitting: Family Medicine

## 2024-01-05 VITALS — BP 137/87 | HR 100 | Temp 98.0°F | Ht 69.0 in | Wt 218.0 lb

## 2024-01-05 DIAGNOSIS — D509 Iron deficiency anemia, unspecified: Secondary | ICD-10-CM | POA: Insufficient documentation

## 2024-01-05 DIAGNOSIS — Z0001 Encounter for general adult medical examination with abnormal findings: Secondary | ICD-10-CM

## 2024-01-05 DIAGNOSIS — J0100 Acute maxillary sinusitis, unspecified: Secondary | ICD-10-CM | POA: Diagnosis not present

## 2024-01-05 DIAGNOSIS — Z6832 Body mass index (BMI) 32.0-32.9, adult: Secondary | ICD-10-CM | POA: Diagnosis not present

## 2024-01-05 DIAGNOSIS — B379 Candidiasis, unspecified: Secondary | ICD-10-CM

## 2024-01-05 DIAGNOSIS — E6609 Other obesity due to excess calories: Secondary | ICD-10-CM | POA: Diagnosis not present

## 2024-01-05 DIAGNOSIS — R350 Frequency of micturition: Secondary | ICD-10-CM

## 2024-01-05 DIAGNOSIS — E559 Vitamin D deficiency, unspecified: Secondary | ICD-10-CM | POA: Diagnosis not present

## 2024-01-05 DIAGNOSIS — E66811 Obesity, class 1: Secondary | ICD-10-CM

## 2024-01-05 DIAGNOSIS — Z0000 Encounter for general adult medical examination without abnormal findings: Secondary | ICD-10-CM

## 2024-01-05 DIAGNOSIS — E0590 Thyrotoxicosis, unspecified without thyrotoxic crisis or storm: Secondary | ICD-10-CM

## 2024-01-05 LAB — URINALYSIS, ROUTINE W REFLEX MICROSCOPIC
Bilirubin, UA: NEGATIVE
Glucose, UA: NEGATIVE
Ketones, UA: NEGATIVE
Leukocytes,UA: NEGATIVE
Nitrite, UA: NEGATIVE
Protein,UA: NEGATIVE
RBC, UA: NEGATIVE
Specific Gravity, UA: 1.015 (ref 1.005–1.030)
Urobilinogen, Ur: 1 mg/dL (ref 0.2–1.0)
pH, UA: 7 (ref 5.0–7.5)

## 2024-01-05 LAB — BAYER DCA HB A1C WAIVED: HB A1C (BAYER DCA - WAIVED): 5.3 % (ref 4.8–5.6)

## 2024-01-05 MED ORDER — AMOXICILLIN 875 MG PO TABS: 875.0000 mg | ORAL_TABLET | Freq: Two times a day (BID) | ORAL | 0 refills | Status: AC

## 2024-01-05 MED ORDER — FLUCONAZOLE 150 MG PO TABS
150.0000 mg | ORAL_TABLET | Freq: Once | ORAL | 0 refills | Status: AC
Start: 2024-01-05 — End: 2024-01-05

## 2024-01-05 NOTE — Progress Notes (Signed)
 Complete physical exam  Patient: Margaret Juarez   DOB: November 24, 1995   28 y.o. Female  MRN: [id number]  Subjective:    Chief Complaint  Patient presents with   Annual Exam    Full panel bw/ low energy   Sinus Problem    A lot of nasal congestion dn post nasal drip. Ongoing since Friday. Treating with ibuprofen.    Weight Gain    Gaining a lot of weight. Currently seeing endocrinologist. Thyroid is being regulated but weight gain is getting worse. Staying active, exercising, calorie deficits, and different diets not helping.     Margaret KRETZSCHMAR is a 28 y.o. female who presents today for a complete physical exam. She reports consuming a  well balanced  diet. Home exercise routine includes endurance, cardio for 2.5 hours total. She generally feels well. She reports sleeping well. She does have additional problems to discuss today.   She is not currently taking an iron or vitamin D supplement. Has been feeling very tired. She is active but has to push herself to be.   Recently started atenolol for tachycardia related to hyperthyroidism. Intermittent palpitations. Feels short of breath after taking atenolol and laying down at bedtime. Thyroid remains overactive. Has had recent follow up with endo. Started on tapazole recently.     She has steadily gained weight over the last few years. She has been exercising daily. She has been counting calories and has been eating 1500-2000 calories a day. Continues to gain weight despite this.   Nasal congestion for 1 week, unchanged. Sinus pressure, especially over left check is worsening. No fever.   Also reports urinary frequency and urgency for a few weeks. No dysuria. Unchanged.   Most recent fall risk assessment:    10/25/2022    2:42 PM  Fall Risk   Falls in the past year? 0  Number falls in past yr: 0  Injury with Fall? 0  Risk for fall due to : No Fall Risks  Follow up Education provided     Most recent depression screenings:     01/05/2024   10:09 AM 10/31/2022    1:36 PM  PHQ 2/9 Scores  PHQ - 2 Score 0 0  PHQ- 9 Score 0 4    Vision:Not within last year  and Dental: No current dental problems and Receives regular dental care  Past Medical History:  Diagnosis Date   Asthma    Chronic pansinusitis    Graves disease 2018   Von Willebrand disease (HCC)       Patient Care Team: Gabriel Earing, FNP as PCP - General (Family Medicine)   Outpatient Medications Prior to Visit  Medication Sig   albuterol (VENTOLIN HFA) 108 (90 Base) MCG/ACT inhaler Inhale 2 puffs into the lungs every 6 (six) hours as needed for wheezing or shortness of breath. (Patient not taking: Reported on 12/10/2023)   atenolol (TENORMIN) 25 MG tablet Take 1 tablet (25 mg total) by mouth at bedtime.   ELLA 30 MG tablet TAKE 1 TAB BY MOUTH AS A SINGLE DOSE WITHIN 5 DAYS (120 HOURS) OF UNPROTECTED SEX (Patient not taking: Reported on 12/10/2023)   etonogestrel-ethinyl estradiol (NUVARING) 0.12-0.015 MG/24HR vaginal ring Insert vaginally and leave in place for 3 consecutive weeks, then remove for 1 week. (Patient not taking: Reported on 12/10/2023)   fluticasone (FLONASE) 50 MCG/ACT nasal spray Place 2 sprays into both nostrils daily. (Patient not taking: Reported on 12/10/2023)   ibuprofen (ADVIL) 600 MG  tablet Take 1 tablet (600 mg total) by mouth every 8 (eight) hours as needed. (Patient not taking: Reported on 12/10/2023)   LORYNA 3-0.02 MG tablet Take 1 tablet by mouth daily. (Patient not taking: Reported on 12/10/2023)   methimazole (TAPAZOLE) 5 MG tablet Take 1 tablet (5 mg total) by mouth daily.   ondansetron (ZOFRAN-ODT) 4 MG disintegrating tablet Take 1 tablet (4 mg total) by mouth every 8 (eight) hours as needed. (Patient not taking: Reported on 12/10/2023)   SODIUM FLUORIDE 5000 PPM 1.1 % PSTE PLEASE SEE ATTACHED FOR DETAILED DIRECTIONS (Patient not taking: Reported on 12/10/2023)   Vitamin D, Ergocalciferol, (DRISDOL) 1.25 MG (50000 UNIT) CAPS  capsule Take 1 capsule (50,000 Units total) by mouth every 7 (seven) days. (Patient not taking: Reported on 12/10/2023)   No facility-administered medications prior to visit.    ROS Negative unless specially indicated above in HPI.       Objective:     BP 137/87   Pulse 100   Temp 98 F (36.7 C)   Ht 5\' 9"  (1.753 m)   SpO2 100%   BMI 31.90 kg/m  Wt Readings from Last 3 Encounters:  01/05/24 218 lb (98.9 kg)  12/10/23 216 lb (98 kg)  10/31/22 208 lb (94.3 kg)      Physical Exam Vitals and nursing note reviewed.  Constitutional:      General: She is not in acute distress.    Appearance: She is obese. She is not ill-appearing, toxic-appearing or diaphoretic.  HENT:     Head: Normocephalic.     Right Ear: Tympanic membrane, ear canal and external ear normal.     Left Ear: Tympanic membrane, ear canal and external ear normal.     Nose: Congestion present.     Right Sinus: Maxillary sinus tenderness present. No frontal sinus tenderness.     Left Sinus: Maxillary sinus tenderness present. No frontal sinus tenderness.     Mouth/Throat:     Mouth: Mucous membranes are moist.     Pharynx: Oropharynx is clear.  Eyes:     Extraocular Movements: Extraocular movements intact.     Conjunctiva/sclera: Conjunctivae normal.     Pupils: Pupils are equal, round, and reactive to light.  Neck:     Thyroid: Thyromegaly present. No thyroid mass or thyroid tenderness.  Cardiovascular:     Rate and Rhythm: Normal rate and regular rhythm.     Pulses: Normal pulses.     Heart sounds: Normal heart sounds. No murmur heard.    No friction rub. No gallop.  Pulmonary:     Effort: Pulmonary effort is normal.     Breath sounds: Normal breath sounds.  Abdominal:     General: Bowel sounds are normal. There is no distension.     Palpations: Abdomen is soft. There is no mass.     Tenderness: There is no abdominal tenderness. There is no guarding.  Musculoskeletal:     Cervical back: Normal  range of motion and neck supple. No tenderness.     Right lower leg: No edema.     Left lower leg: No edema.  Skin:    General: Skin is warm and dry.     Capillary Refill: Capillary refill takes less than 2 seconds.     Findings: No lesion or rash.  Neurological:     General: No focal deficit present.     Mental Status: She is alert and oriented to person, place, and time.  Cranial Nerves: No cranial nerve deficit.     Motor: No weakness.     Gait: Gait normal.  Psychiatric:        Mood and Affect: Mood normal.        Behavior: Behavior normal.        Thought Content: Thought content normal.        Judgment: Judgment normal.      No results found for any visits on 01/05/24.     Assessment & Plan:    Chequita was seen today for annual exam, sinus problem and weight gain.  Diagnoses and all orders for this visit:  Routine general medical examination at a health care facility  Vitamin D deficiency Not on supplement. Labs pending.  -     VITAMIN D 25 Hydroxy (Vit-D Deficiency, Fractures)  Iron deficiency anemia, unspecified iron deficiency anemia type Not on supplement. Labs pending. Reporting fatigue.  -     Anemia Profile B  Hyperthyroidism Managed by endo. Started on tapazole.   Class 1 obesity due to excess calories without serious comorbidity with body mass index (BMI) of 32.0 to 32.9 in adult [facility] discussed and ordered. Patient's BMI is >30 mg/m2.  Patient's current BMI is Body mass index is 32.19 kg/m.Marland Kitchen  Patient is currently enrolled in a healthy eating plan along with encouraged exercise.  Patient has contraindications to phentermine, Contrave & Qsymia (contains phentermine) due to hyperthyroidism.  Patient does not have a personal or family history of medullary thyroid carcinoma (MTC) or Multiple Endocrine Neoplasia syndrome type 2 (MEN 2). -     CBC with Differential/Platelet -     Lipid panel -     CMP14+EGFR -     Bayer DCA Hb A1c Waived  Urinary  frequency Labs pending. No dysuria.  -     Bayer DCA Hb A1c Waived -     Urinalysis, Routine w reflex microscopic  Acute non-recurrent maxillary sinusitis Discussed symptomatic care and return precautions.  -     amoxicillin (AMOXIL) 875 MG tablet; Take 1 tablet (875 mg total) by mouth 2 (two) times daily for 7 days.  Antibiotic-induced yeast infection -     fluconazole (DIFLUCAN) 150 MG tablet; Take 1 tablet (150 mg total) by mouth once for 1 dose. Repeat in 3 days if symptoms persist.       Immunization History  Administered Date(s) Administered   Tdap 04/17/2018, 06/04/2021    Health Maintenance  Topic Date Due   HIV Screening  Never done   Hepatitis C Screening  Never done   INFLUENZA VACCINE  01/12/2024 (Originally 05/15/2023)   COVID-19 Vaccine (1 - 2024-25 season) 01/21/2024 (Originally 06/15/2023)   Cervical Cancer Screening (Pap smear)  09/28/2024   DTaP/Tdap/Td (3 - Td or Tdap) 06/05/2031   HPV VACCINES  Aged Out    Discussed health benefits of physical activity, and encouraged her to engage in regular exercise appropriate for her age and condition.  Problem List Items Addressed This Visit       Endocrine   Hyperthyroidism     Other   Class 1 obesity due to excess calories without serious comorbidity with body mass index (BMI) of 30.0 to 30.9 in adult   Relevant Medications   Semaglutide-Weight Management 0.25 MG/0.5ML SOAJ   Semaglutide-Weight Management 0.5 MG/0.5ML SOAJ (Start on 02/07/2024)   Semaglutide-Weight Management 1 MG/0.5ML SOAJ (Start on 03/07/2024)   Semaglutide-Weight Management 1.7 MG/0.75ML SOAJ (Start on 04/05/2024)   Semaglutide-Weight Management 2.4 MG/0.75ML SOAJ (  Start on 05/04/2024)   Iron deficiency anemia   Relevant Orders   Anemia Profile B (Completed)   Vitamin D deficiency   Relevant Orders   VITAMIN D 25 Hydroxy (Vit-D Deficiency, Fractures) (Completed)   Other Visit Diagnoses       Routine general medical examination at a  health care facility    -  Primary     Urinary frequency       Relevant Orders   Bayer DCA Hb A1c Waived (Completed)   Urinalysis, Routine w reflex microscopic (Completed)     Acute non-recurrent maxillary sinusitis       Relevant Medications   amoxicillin (AMOXIL) 875 MG tablet     Antibiotic-induced yeast infection          Return in 1 year (on 01/04/2025), or if symptoms worsen or fail to improve, for CPE.   The patient indicates understanding of these issues and agrees with the plan.  Gabriel Earing, FNP

## 2024-01-05 NOTE — Patient Instructions (Signed)

## 2024-01-06 ENCOUNTER — Telehealth: Payer: Self-pay | Admitting: Family Medicine

## 2024-01-06 DIAGNOSIS — D509 Iron deficiency anemia, unspecified: Secondary | ICD-10-CM

## 2024-01-06 LAB — ANEMIA PROFILE B
Basophils Absolute: 0.1 10*3/uL (ref 0.0–0.2)
Basos: 1 %
EOS (ABSOLUTE): 0.2 10*3/uL (ref 0.0–0.4)
Eos: 4 %
Ferritin: 5 ng/mL — ABNORMAL LOW (ref 15–150)
Folate: 4 ng/mL (ref >3.0)
Hematocrit: 26.7 % — ABNORMAL LOW (ref 34.0–46.6)
Hemoglobin: 7.1 g/dL — CL (ref 11.1–15.9)
Immature Grans (Abs): 0 10*3/uL (ref 0.0–0.1)
Immature Granulocytes: 0 %
Iron Saturation: 2 % — CL (ref 15–55)
Iron: 12 ug/dL — ABNORMAL LOW (ref 27–159)
Lymphocytes Absolute: 1.6 10*3/uL (ref 0.7–3.1)
Lymphs: 24 %
MCH: 14.9 pg — ABNORMAL LOW (ref 26.6–33.0)
MCHC: 26.6 g/dL — ABNORMAL LOW (ref 31.5–35.7)
MCV: 56 fL — ABNORMAL LOW (ref 79–97)
Monocytes Absolute: 0.7 10*3/uL (ref 0.1–0.9)
Monocytes: 11 %
Neutrophils Absolute: 4 10*3/uL (ref 1.4–7.0)
Neutrophils: 60 %
Platelets: 330 10*3/uL (ref 150–450)
RBC: 4.75 x10E6/uL (ref 3.77–5.28)
RDW: 19 % — ABNORMAL HIGH (ref 11.7–15.4)
Retic Ct Pct: 1.3 % (ref 0.6–2.6)
Total Iron Binding Capacity: 505 ug/dL — ABNORMAL HIGH (ref 250–450)
UIBC: 493 ug/dL — ABNORMAL HIGH (ref 131–425)
Vitamin B-12: 718 pg/mL (ref 232–1245)
WBC: 6.5 10*3/uL (ref 3.4–10.8)

## 2024-01-06 LAB — CMP14+EGFR
ALT: 18 IU/L (ref 0–32)
AST: 25 IU/L (ref 0–40)
Albumin: 4.2 g/dL (ref 4.0–5.0)
Alkaline Phosphatase: 88 IU/L (ref 44–121)
BUN/Creatinine Ratio: 16 (ref 9–23)
BUN: 10 mg/dL (ref 6–20)
Bilirubin Total: 0.3 mg/dL (ref 0.0–1.2)
CO2: 21 mmol/L (ref 20–29)
Calcium: 9 mg/dL (ref 8.7–10.2)
Chloride: 104 mmol/L (ref 96–106)
Creatinine, Ser: 0.63 mg/dL (ref 0.57–1.00)
Globulin, Total: 3.2 g/dL (ref 1.5–4.5)
Glucose: 98 mg/dL (ref 70–99)
Potassium: 4.3 mmol/L (ref 3.5–5.2)
Sodium: 137 mmol/L (ref 134–144)
Total Protein: 7.4 g/dL (ref 6.0–8.5)
eGFR: 125 mL/min/{1.73_m2} (ref >59)

## 2024-01-06 LAB — LIPID PANEL
Chol/HDL Ratio: 2.4 ratio (ref 0.0–4.4)
Cholesterol, Total: 107 mg/dL (ref 100–199)
HDL: 44 mg/dL (ref >39)
LDL Chol Calc (NIH): 49 mg/dL (ref 0–99)
Triglycerides: 65 mg/dL (ref 0–149)
VLDL Cholesterol Cal: 14 mg/dL (ref 5–40)

## 2024-01-06 LAB — VITAMIN D 25 HYDROXY (VIT D DEFICIENCY, FRACTURES): Vit D, 25-Hydroxy: 25.6 ng/mL — ABNORMAL LOW (ref 30.0–100.0)

## 2024-01-06 NOTE — Telephone Encounter (Signed)
 Patient states she is currently not having any symptoms.  She will get over the counter supplements and take as directed. I gave her the phone number for [facility]

## 2024-01-09 ENCOUNTER — Other Ambulatory Visit (HOSPITAL_COMMUNITY): Payer: Self-pay

## 2024-01-09 DIAGNOSIS — M66362 Spontaneous rupture of flexor tendons, left lower leg: Secondary | ICD-10-CM | POA: Diagnosis not present

## 2024-01-09 MED ORDER — SEMAGLUTIDE-WEIGHT MANAGEMENT 2.4 MG/0.75ML ~~LOC~~ SOAJ: 2.4000 mg | SUBCUTANEOUS | 6 refills | Status: AC

## 2024-01-09 MED ORDER — SEMAGLUTIDE-WEIGHT MANAGEMENT 0.5 MG/0.5ML ~~LOC~~ SOAJ: 0.5000 mg | SUBCUTANEOUS | 0 refills | Status: AC

## 2024-01-09 MED ORDER — SEMAGLUTIDE-WEIGHT MANAGEMENT 0.25 MG/0.5ML ~~LOC~~ SOAJ: 0.2500 mg | SUBCUTANEOUS | 0 refills | Status: AC

## 2024-01-09 MED ORDER — SEMAGLUTIDE-WEIGHT MANAGEMENT 1.7 MG/0.75ML ~~LOC~~ SOAJ: 1.7000 mg | SUBCUTANEOUS | 0 refills | Status: AC

## 2024-01-09 MED ORDER — SEMAGLUTIDE-WEIGHT MANAGEMENT 1 MG/0.5ML ~~LOC~~ SOAJ: 1.0000 mg | SUBCUTANEOUS | 0 refills | Status: AC

## 2024-01-09 NOTE — Addendum Note (Signed)
 Addended by: Gabriel Earing on: 01/09/2024 08:36 AM   Modules accepted: Orders

## 2024-01-12 ENCOUNTER — Telehealth: Payer: Self-pay

## 2024-01-12 ENCOUNTER — Other Ambulatory Visit (HOSPITAL_COMMUNITY): Payer: Self-pay

## 2024-01-12 NOTE — Telephone Encounter (Signed)
 Pharmacy Patient Advocate Encounter   Received notification from Onbase that prior authorization for [facility] 0.25MG /0.5ML auto-injectors is required/requested.   Insurance verification completed.   The patient is insured through [facility] .   Per test claim: PA required; PA submitted to above mentioned insurance via CoverMyMeds Key/confirmation #/EOC [id number] Status is pending

## 2024-01-12 NOTE — Telephone Encounter (Signed)
 Pharmacy Patient Advocate Encounter  Received notification from [facility] that Prior Authorization for [employer] 0.25MG /0.5ML auto-injectors has been APPROVED from 01/12/24 to 07/10/24. Ran test claim, Copay is $4. This test claim was processed through [facility]- copay amounts may vary at other pharmacies due to pharmacy/plan contracts, or as the patient moves through the different stages of their insurance plan.   PA #/Case ID/Reference #: [id number]

## 2024-01-14 NOTE — Progress Notes (Incomplete)
 [facility] [address], [place] [address]   Clinic Day:  01/14/2024  Referring physician: Arrie Senate*  Patient Care Team: Gabriel Earing, FNP as PCP - General (Family Medicine)   ASSESSMENT & PLAN:   Assessment: ***  Plan: ***  No orders of the defined types were placed in this encounter.     Margaret Juarez,acting as a Neurosurgeon for Doreatha Massed, MD.,have documented all relevant documentation on the behalf of Doreatha Massed, MD,as directed by  Doreatha Massed, MD while in the presence of Doreatha Massed, MD.   ***  Medley R Juarez   4/2/20259:44 AM  CHIEF COMPLAINT/PURPOSE OF CONSULT:   Diagnosis: ***  Current Therapy:  ***  HISTORY OF PRESENT ILLNESS:   Margaret Juarez is a 28 y.o. female presenting to clinic today for evaluation of anemia at the request of Milian, Aleen Campi, FNP.  She has a medical history of hyperthyroidism, graves' disease, obesity, and vitamin D deficiciency.   Patient's anemia profile B from 01/05/24 found elevated TIBC at 505, elevated UIBC at 493, low iron at 12, severely low iron saturation at 2, low ferritin at 5, low HGB at 7.1, low HCT at 26.7, low MCV at 56, low MH at 14.9, low MCHC at 26.6, and elevated RDW at 19.0. Her vitamin D from 01/05/24 was low at 25.6.   Today, she states that she is doing well overall. Her appetite level is at ***%. Her energy level is at ***%.   PAST MEDICAL HISTORY:   Past Medical History: Past Medical History:  Diagnosis Date   Asthma    Chronic pansinusitis    Graves disease 2018   Von Willebrand disease (HCC)     Surgical History: Past Surgical History:  Procedure Laterality Date   WISDOM TOOTH EXTRACTION      Social History: Social History   Socioeconomic History   Marital status: Single    Spouse name: Not on file   Number of children: Not on file   Years of education: Not on file   Highest education level: Not on file   Occupational History   Not on file  Tobacco Use   Smoking status: Never   Smokeless tobacco: Never  Vaping Use   Vaping status: Never Used  Substance and Sexual Activity   Alcohol use: No   Drug use: Not on file   Sexual activity: Yes    Birth control/protection: Inserts    Comment: NUVARING  Other Topics Concern   Not on file  Social History Narrative   Not on file   Social Drivers of Health   Financial Resource Strain: Not on file  Food Insecurity: Not on file  Transportation Needs: Not on file  Physical Activity: Not on file  Stress: Not on file  Social Connections: Not on file  Intimate Partner Violence: Not At Risk (04/03/2022)   Received from [facility]   Humiliation, Afraid, Rape, and Kick questionnaire    Fear of Current or Ex-Partner: No    Emotionally Abused: No    Physically Abused: No    Sexually Abused: No    Family History: Family History  Problem Relation Age of Onset   Osteoarthritis Mother    Diabetes Father    Heart disease Father    Osteoarthritis Father    Osteoarthritis Paternal Aunt    Breast cancer Paternal Aunt     Current Medications:  Current Outpatient Medications:    albuterol (  VENTOLIN HFA) 108 (90 Base) MCG/ACT inhaler, Inhale 2 puffs into the lungs every 6 (six) hours as needed for wheezing or shortness of breath. (Patient not taking: Reported on 12/10/2023), Disp: 8 g, Rfl: 0   atenolol (TENORMIN) 25 MG tablet, Take 1 tablet (25 mg total) by mouth at bedtime., Disp: 90 tablet, Rfl: 3   fluticasone (FLONASE) 50 MCG/ACT nasal spray, Place 2 sprays into both nostrils daily. (Patient not taking: Reported on 12/10/2023), Disp: 16 g, Rfl: 6   methimazole (TAPAZOLE) 5 MG tablet, Take 1 tablet (5 mg total) by mouth daily., Disp: 90 tablet, Rfl: 2   Semaglutide-Weight Management 0.25 MG/0.5ML SOAJ, Inject 0.25 mg into the skin once a week for 28 days., Disp: 2 mL, Rfl: 0   [START ON 02/07/2024] Semaglutide-Weight  Management 0.5 MG/0.5ML SOAJ, Inject 0.5 mg into the skin once a week for 28 days., Disp: 2 mL, Rfl: 0   [START ON 03/07/2024] Semaglutide-Weight Management 1 MG/0.5ML SOAJ, Inject 1 mg into the skin once a week for 28 days., Disp: 2 mL, Rfl: 0   [START ON 04/05/2024] Semaglutide-Weight Management 1.7 MG/0.75ML SOAJ, Inject 1.7 mg into the skin once a week for 28 days., Disp: 3 mL, Rfl: 0   [START ON 05/04/2024] Semaglutide-Weight Management 2.4 MG/0.75ML SOAJ, Inject 2.4 mg into the skin once a week for 28 days., Disp: 3 mL, Rfl: 6   Allergies: Allergies  Allergen Reactions   Mangifera Indica Anaphylaxis   Mango Butter    Cephalexin Hives    REVIEW OF SYSTEMS:   Review of Systems  Constitutional:  Negative for chills, fatigue and fever.  HENT:   Negative for lump/mass, mouth sores, nosebleeds, sore throat and trouble swallowing.   Eyes:  Negative for eye problems.  Respiratory:  Negative for cough and shortness of breath.   Cardiovascular:  Negative for chest pain, leg swelling and palpitations.  Gastrointestinal:  Negative for abdominal pain, constipation, diarrhea, nausea and vomiting.  Genitourinary:  Negative for bladder incontinence, difficulty urinating, dysuria, frequency, hematuria and nocturia.   Musculoskeletal:  Negative for arthralgias, back pain, flank pain, myalgias and neck pain.  Skin:  Negative for itching and rash.  Neurological:  Negative for dizziness, headaches and numbness.  Hematological:  Does not bruise/bleed easily.  Psychiatric/Behavioral:  Negative for depression, sleep disturbance and suicidal ideas. The patient is not nervous/anxious.   All other systems reviewed and are negative.    VITALS:   There were no vitals taken for this visit.  Wt Readings from Last 3 Encounters:  01/05/24 218 lb (98.9 kg)  12/10/23 216 lb (98 kg)  10/31/22 208 lb (94.3 kg)    There is no height or weight on file to calculate BMI.   PHYSICAL EXAM:   Physical  Exam Vitals and nursing note reviewed. Exam conducted with a chaperone present.  Constitutional:      Appearance: Normal appearance.  Cardiovascular:     Rate and Rhythm: Normal rate and regular rhythm.     Pulses: Normal pulses.     Heart sounds: Normal heart sounds.  Pulmonary:     Effort: Pulmonary effort is normal.     Breath sounds: Normal breath sounds.  Abdominal:     Palpations: Abdomen is soft. There is no hepatomegaly, splenomegaly or mass.     Tenderness: There is no abdominal tenderness.  Musculoskeletal:     Right lower leg: No edema.     Left lower leg: No edema.  Lymphadenopathy:  Cervical: No cervical adenopathy.     Right cervical: No superficial, deep or posterior cervical adenopathy.    Left cervical: No superficial, deep or posterior cervical adenopathy.     Upper Body:     Right upper body: No supraclavicular or axillary adenopathy.     Left upper body: No supraclavicular or axillary adenopathy.  Neurological:     General: No focal deficit present.     Mental Status: She is alert and oriented to person, place, and time.  Psychiatric:        Mood and Affect: Mood normal.        Behavior: Behavior normal.     LABS:   CBC    Component Value Date/Time   WBC 6.5 01/05/2024 1035   RBC 4.75 01/05/2024 1035   HGB 7.1 (LL) 01/05/2024 1035   HCT 26.7 (L) 01/05/2024 1035   PLT 330 01/05/2024 1035   MCV 56 (L) 01/05/2024 1035   MCH 14.9 (L) 01/05/2024 1035   MCHC 26.6 (L) 01/05/2024 1035   RDW 19.0 (H) 01/05/2024 1035   LYMPHSABS 1.6 01/05/2024 1035   EOSABS 0.2 01/05/2024 1035   BASOSABS 0.1 01/05/2024 1035    CMP    Component Value Date/Time   NA 137 01/05/2024 1035   K 4.3 01/05/2024 1035   CL 104 01/05/2024 1035   CO2 21 01/05/2024 1035   GLUCOSE 98 01/05/2024 1035   GLUCOSE 92 03/19/2018 0920   BUN 10 01/05/2024 1035   CREATININE 0.63 01/05/2024 1035   CREATININE 0.46 (L) 03/19/2018 0920   CALCIUM 9.0 01/05/2024 1035   PROT 7.4  01/05/2024 1035   ALBUMIN 4.2 01/05/2024 1035   AST 25 01/05/2024 1035   ALT 18 01/05/2024 1035   ALKPHOS 88 01/05/2024 1035   BILITOT 0.3 01/05/2024 1035   GFRNONAA 142 03/19/2018 0920   GFRAA 165 03/19/2018 0920    No results found for: "CEA1", "CEA" / No results found for: "CEA1", "CEA" No results found for: "PSA1" No results found for: "OAC166" No results found for: "CAN125"  No results found for: "TOTALPROTELP", "ALBUMINELP", "A1GS", "A2GS", "BETS", "BETA2SER", "GAMS", "MSPIKE", "SPEI" Lab Results  Component Value Date   TIBC 505 (H) 01/05/2024   TIBC 526 (H) 10/31/2022   FERRITIN 5 (L) 01/05/2024   FERRITIN 4 (L) 10/31/2022   IRONPCTSAT 2 (LL) 01/05/2024   IRONPCTSAT 3 (LL) 10/31/2022   No results found for: "LDH"   STUDIES:   No results found.

## 2024-01-15 ENCOUNTER — Inpatient Hospital Stay: Admitting: Hematology

## 2024-01-15 ENCOUNTER — Inpatient Hospital Stay

## 2024-01-15 ENCOUNTER — Other Ambulatory Visit: Payer: Self-pay

## 2024-01-16 ENCOUNTER — Inpatient Hospital Stay

## 2024-01-16 ENCOUNTER — Inpatient Hospital Stay: Admitting: Oncology

## 2024-01-19 ENCOUNTER — Telehealth: Payer: Self-pay | Admitting: Family Medicine

## 2024-01-19 DIAGNOSIS — D509 Iron deficiency anemia, unspecified: Secondary | ICD-10-CM

## 2024-01-19 NOTE — Telephone Encounter (Signed)
 Do we ask patient she hasn't followed up with hematology? Please place referral to [facility] or [facility]. It is very important that she follow through with this. Is she having any dizziness, palpitations, shortness of breath?       Pt agrees for referral at [facility] so referral placed and not symptomatic now.

## 2024-02-11 ENCOUNTER — Encounter: Payer: Self-pay | Admitting: Family Medicine

## 2024-03-09 ENCOUNTER — Ambulatory Visit: Payer: Medicaid Other | Admitting: Internal Medicine

## 2024-03-09 NOTE — Progress Notes (Deleted)
 Name: Margaret Juarez  MRN/ DOB: [id number], October 08, 1996    Age/ Sex: 28 y.o., female    PCP: Albertha Huger, FNP   Reason for Endocrinology Evaluation: Hyperthyroidism     Date of Initial Endocrinology Evaluation: 12/10/2023    HPI: Margaret Juarez is a 28 y.o. female with a past medical history of Hyperthyroidism. The patient presented for initial endocrinology clinic visit on 12/10/2023 for consultative assistance with her Hyperthyroidism.     She was diagnosed with hyperthyroidism in 2016 during evaluation for palpitations    The patient was noted with a suppressed TSH of <0.01 uIU/mL in 2019 with normal free T3 and free T4.  She was pregnant at the time  Patient had overt hyperthyroidism in 2022, with a suppressed TSH 0.005 uIU/mL, elevated free T4 at 2.61 IU/mL, elevated free T3 at 7.5 pg/mL  Of note, patient had elevated anti-TPO antibodies at 105 IU/mL as well as thyroglobulin antibodies 02/2021.  Thyroid  uptake and scan 11/18/2016 showed significantly elevated 24-hour radioiodine uptake of 71%, findings consistent with Graves' disease  The patient established with Dr. 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  Has been noted with weight gain  Has occasional local neck swelling  Has been having low energy and fatigue  Denies constipation or diarrhea  Continues with tremors  Continues with palpitations   Atenolol  25 mg at bedtime Start Methimazole  5 mg daily        HISTORY:  Past Medical History:  Past Medical History:  Diagnosis Date   Asthma    Chronic pansinusitis    Graves disease 2018   Von Willebrand disease (HCC)    Past Surgical History:  Past Surgical History:  Procedure Laterality Date   WISDOM TOOTH EXTRACTION      Social History:  reports that she has never smoked. She has never used smokeless tobacco.  Drug: Marijuana. She reports that she does not drink alcohol. Family History: family history includes Breast cancer in her paternal aunt; Diabetes in her father; Heart disease in her father; Osteoarthritis in her father, mother, and paternal aunt.   HOME MEDICATIONS: Allergies as of 03/09/2024       Reactions   Mangifera Indica Anaphylaxis   Mango Butter    Cephalexin Hives        Medication List        Accurate as of Mar 09, 2024  7:37 AM. If you have any questions, ask your nurse or doctor.          albuterol  108 (90 Base) MCG/ACT inhaler Commonly known as: VENTOLIN  HFA Inhale 2 puffs into the lungs every 6 (six) hours as needed for wheezing or shortness of breath.   atenolol  25 MG tablet Commonly known as: TENORMIN  Take 1 tablet (25 mg total) by mouth at bedtime.   fluticasone  50 MCG/ACT nasal spray Commonly known as: FLONASE  Place 2 sprays into both nostrils daily.   Loryna 3-0.02 MG tablet  Generic drug: drospirenone-ethinyl estradiol  Take 1 tablet by mouth daily.   methimazole  5 MG tablet Commonly known as: TAPAZOLE  Take 1 tablet (5 mg total) by mouth daily.   Semaglutide -Weight Management 1 MG/0.5ML Soaj Inject 1 mg into the skin once a week for 28 days.   Semaglutide -Weight Management 1.7 MG/0.75ML Soaj Inject 1.7 mg into the skin once a week for 28 days. Start taking on: April 05, 2024   Semaglutide -Weight Management 2.4 MG/0.75ML Soaj Inject 2.4 mg into the skin once a week for 28 days. Start taking on: May 04, 2024          REVIEW OF SYSTEMS: A comprehensive ROS was  conducted with the patient and is negative except as per HPI     OBJECTIVE:  VS: There were no vitals taken for this visit.   Wt Readings from Last 3 Encounters:  01/05/24 218 lb (98.9 kg)  12/10/23 216 lb (98 kg)  10/31/22 208 lb (94.3 kg)     EXAM: General: Pt appears well and is in NAD  Eyes: External eye exam normal without stare, lid lag or exophthalmos.  EOM intact.  PERRL.  Neck: General: Supple without adenopathy. Thyroid : Thyroid  size normal.  No goiter or nodules appreciated. No thyroid  bruit.  Lungs: Clear with good BS bilat   Heart: Auscultation: RRR.  Abdomen: Soft, nontender  Extremities:  BL LE: No pretibial edema   Mental Status: Judgment, insight: Intact Orientation: Oriented to time, place, and person Mood and affect: No depression, anxiety, or agitation     DATA REVIEWED:     Latest Reference Range & Units 12/10/23 10:31  TSH mIU/L 0.01 (L)  Triiodothyronine,Free,Serum 2.3 - 4.2 pg/mL 4.0  T4,Free(Direct) 0.8 - 1.8 ng/dL 1.5    Thyroid  Uptake and Scan 11/19/2016 FINDINGS: 24 hour radio iodine uptake is calculated at 71%, well above normal range consistent with hyperthyroidism.   Images of the thyroid  gland in 3 projections demonstrate homogeneous diffusely increased tracer localization throughout both thyroid  lobes.   No definite areas of increased or decreased tracer localization seen.   IMPRESSION: Significantly elevated 24 hour radio iodine uptake of 71%.   Homogeneous increased tracer distribution throughout both thyroid  lobes.   Findings consistent with Graves disease.      ASSESSMENT/PLAN/RECOMMENDATIONS:   Hyperthyroidism  -Patient is clinically euthyroid -Has occasional local neck swelling -Has been on and off methimazole  over the years   Medications : Start Atenolol  25 mg at bedtime Start Methimazole  5 mg daily     2. Graves' Disease:  -Patient with burning and itching of the eyes   Follow-up in 3  months  Signed electronically by: Natale Bail, MD  [facility] Endocrinology  [facility] [address] [place] [place], [place] [address] Phone: [phone] FAX: [phone]   CC: Albertha Huger, FNP [address] [place] [address] Phone: [phone] Fax: [phone]   Return to Endocrinology clinic as below: Future Appointments  Date Time Provider Department Center  03/09/2024 12:10 PM Harshini Trent, Julian Obey, MD [facility] None  01/05/2025 10:00 AM Albertha Huger, FNP [facility] None

## 2024-03-19 ENCOUNTER — Ambulatory Visit: Admitting: Internal Medicine

## 2024-03-19 NOTE — Progress Notes (Deleted)
 Name: Margaret Juarez  MRN/ DOB: [id number], 1995-12-03    Age/ Sex: 28 y.o., female    PCP: Albertha Huger, FNP   Reason for Endocrinology Evaluation: Hyperthyroidism     Date of Initial Endocrinology Evaluation: 12/10/2023    HPI: [name] is a 28 y.o. female with a past medical history of Hyperthyroidism. The patient presented for initial endocrinology clinic visit on 12/10/2023 for consultative assistance with her Hyperthyroidism.     She was diagnosed with hyperthyroidism in 2016 during evaluation for palpitations    The patient was noted with a suppressed TSH of <0.01 uIU/mL in 2019 with normal free T3 and free T4.  She was pregnant at the time  Patient had overt hyperthyroidism in 2022, with a suppressed TSH 0.005 uIU/mL, elevated free T4 at 2.61 IU/mL, elevated free T3 at 7.5 pg/mL  Of note, patient had elevated anti-TPO antibodies at 105 IU/mL as well as thyroglobulin antibodies 02/2021.  Thyroid  uptake and scan 11/18/2016 showed significantly elevated 24-hour radioiodine uptake of 71%, findings consistent with Graves' disease  The patient established with Dr. 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  Has been noted with weight gain  Has occasional local neck swelling  Has been having low energy and fatigue  Denies constipation or diarrhea  Continues with tremors  Continues with palpitations   Atenolol  25 mg at bedtime Methimazole  5 mg daily        HISTORY:  Past Medical History:  Past Medical History:  Diagnosis Date   Asthma    Chronic pansinusitis    Graves disease 2018   Von Willebrand disease (HCC)    Past Surgical History:  Past Surgical History:  Procedure Laterality Date   WISDOM TOOTH EXTRACTION      Social History:  reports that she has never smoked. She has never used smokeless tobacco.  Drug: Marijuana. She reports that she does not drink alcohol. Family History: family history includes Breast cancer in her paternal aunt; Diabetes in her father; Heart disease in her father; Osteoarthritis in her father, mother, and paternal aunt.   HOME MEDICATIONS: Allergies as of 03/19/2024       Reactions   Mangifera Indica Anaphylaxis   Mango Butter    Cephalexin Hives        Medication List        Accurate as of March 19, 2024  7:24 AM. If you have any questions, ask your nurse or doctor.          albuterol  108 (90 Base) MCG/ACT inhaler Commonly known as: VENTOLIN  HFA Inhale 2 puffs into the lungs every 6 (six) hours as needed for wheezing or shortness of breath.   atenolol  25 MG tablet Commonly known as: TENORMIN  Take 1 tablet (25 mg total) by mouth at bedtime.   fluticasone  50 MCG/ACT nasal spray Commonly known as: FLONASE  Place 2 sprays into both nostrils daily.   Loryna 3-0.02 MG tablet Generic  drug: drospirenone-ethinyl estradiol  Take 1 tablet by mouth daily.   methimazole  5 MG tablet Commonly known as: TAPAZOLE  Take 1 tablet (5 mg total) by mouth daily.   Semaglutide -Weight Management 1 MG/0.5ML Soaj Inject 1 mg into the skin once a week for 28 days.   Semaglutide -Weight Management 1.7 MG/0.75ML Soaj Inject 1.7 mg into the skin once a week for 28 days. Start taking on: April 05, 2024   Semaglutide -Weight Management 2.4 MG/0.75ML Soaj Inject 2.4 mg into the skin once a week for 28 days. Start taking on: May 04, 2024          REVIEW OF SYSTEMS: A comprehensive ROS was  conducted with the patient and is negative except as per HPI     OBJECTIVE:  VS: There were no vitals taken for this visit.   Wt Readings from Last 3 Encounters:  01/05/24 218 lb (98.9 kg)  12/10/23 216 lb (98 kg)  10/31/22 208 lb (94.3 kg)     EXAM: General: Pt appears well and is in NAD  Eyes: External eye exam normal without stare, lid lag or exophthalmos.  EOM intact.  PERRL.  Neck: General: Supple without adenopathy. Thyroid : Thyroid  size normal.  No goiter or nodules appreciated. No thyroid  bruit.  Lungs: Clear with good BS bilat   Heart: Auscultation: RRR.  Abdomen: Soft, nontender  Extremities:  BL LE: No pretibial edema   Mental Status: Judgment, insight: Intact Orientation: Oriented to time, place, and person Mood and affect: No depression, anxiety, or agitation     DATA REVIEWED:     Latest Reference Range & Units 12/10/23 10:31  TSH mIU/L 0.01 (L)  Triiodothyronine,Free,Serum 2.3 - 4.2 pg/mL 4.0  T4,Free(Direct) 0.8 - 1.8 ng/dL 1.5    Thyroid  Uptake and Scan 11/19/2016 FINDINGS: 24 hour radio iodine uptake is calculated at 71%, well above normal range consistent with hyperthyroidism.   Images of the thyroid  gland in 3 projections demonstrate homogeneous diffusely increased tracer localization throughout both thyroid  lobes.   No definite areas of increased or decreased tracer localization seen.   IMPRESSION: Significantly elevated 24 hour radio iodine uptake of 71%.   Homogeneous increased tracer distribution throughout both thyroid  lobes.   Findings consistent with Graves disease.      ASSESSMENT/PLAN/RECOMMENDATIONS:   Hyperthyroidism  -Patient is clinically euthyroid -Has occasional local neck swelling -Has been on and off methimazole  over the years   Medications : Start Atenolol  25 mg at bedtime Start Methimazole  5 mg daily     2. Graves' Disease:  -Patient with burning and itching of the eyes   Follow-up in 3  months  Signed electronically by: Natale Bail, MD  [facility] Endocrinology  [facility] [address] [place] [place], [place] [address] Phone: [phone] FAX: [phone]   CC: Albertha Huger, FNP [address] [place] [address] Phone: [phone] Fax: [phone]   Return to Endocrinology clinic as below: Future Appointments  Date Time Provider Department Center  03/19/2024 10:50 AM Brunilda Eble, Julian Obey, MD [facility] None  01/05/2025 10:00 AM Albertha Huger, FNP [facility] None

## 2024-03-31 DIAGNOSIS — M9914 Subluxation complex (vertebral) of sacral region: Secondary | ICD-10-CM | POA: Diagnosis not present

## 2024-03-31 DIAGNOSIS — M9915 Subluxation complex (vertebral) of pelvic region: Secondary | ICD-10-CM | POA: Diagnosis not present

## 2024-03-31 DIAGNOSIS — M9913 Subluxation complex (vertebral) of lumbar region: Secondary | ICD-10-CM | POA: Diagnosis not present

## 2024-03-31 DIAGNOSIS — M9912 Subluxation complex (vertebral) of thoracic region: Secondary | ICD-10-CM | POA: Diagnosis not present

## 2024-04-04 DIAGNOSIS — R079 Chest pain, unspecified: Secondary | ICD-10-CM | POA: Diagnosis not present

## 2024-04-04 DIAGNOSIS — E0500 Thyrotoxicosis with diffuse goiter without thyrotoxic crisis or storm: Secondary | ICD-10-CM | POA: Diagnosis not present

## 2024-04-04 DIAGNOSIS — R002 Palpitations: Secondary | ICD-10-CM | POA: Diagnosis not present

## 2024-04-04 DIAGNOSIS — D649 Anemia, unspecified: Secondary | ICD-10-CM | POA: Diagnosis not present

## 2024-04-09 ENCOUNTER — Ambulatory Visit: Admitting: Family Medicine

## 2024-04-09 ENCOUNTER — Other Ambulatory Visit: Payer: Self-pay

## 2024-04-09 ENCOUNTER — Encounter: Payer: Self-pay | Admitting: Family Medicine

## 2024-04-09 ENCOUNTER — Emergency Department (HOSPITAL_COMMUNITY)
Admission: EM | Admit: 2024-04-09 | Discharge: 2024-04-09 | Disposition: A | Discharge status: Home / Self Care | Attending: Emergency Medicine | Admitting: Emergency Medicine

## 2024-04-09 ENCOUNTER — Encounter (HOSPITAL_COMMUNITY): Payer: Self-pay

## 2024-04-09 VITALS — BP 122/72 | HR 80 | Temp 97.7°F | Ht 69.0 in | Wt 209.6 lb

## 2024-04-09 DIAGNOSIS — R519 Headache, unspecified: Secondary | ICD-10-CM | POA: Diagnosis not present

## 2024-04-09 DIAGNOSIS — Z9889 Other specified postprocedural states: Secondary | ICD-10-CM

## 2024-04-09 DIAGNOSIS — E66811 Obesity, class 1: Secondary | ICD-10-CM | POA: Diagnosis not present

## 2024-04-09 DIAGNOSIS — Z6830 Body mass index (BMI) 30.0-30.9, adult: Secondary | ICD-10-CM

## 2024-04-09 DIAGNOSIS — E6609 Other obesity due to excess calories: Secondary | ICD-10-CM | POA: Diagnosis not present

## 2024-04-09 DIAGNOSIS — D649 Anemia, unspecified: Secondary | ICD-10-CM | POA: Insufficient documentation

## 2024-04-09 DIAGNOSIS — N939 Abnormal uterine and vaginal bleeding, unspecified: Secondary | ICD-10-CM | POA: Insufficient documentation

## 2024-04-09 DIAGNOSIS — D509 Iron deficiency anemia, unspecified: Secondary | ICD-10-CM

## 2024-04-09 DIAGNOSIS — E0590 Thyrotoxicosis, unspecified without thyrotoxic crisis or storm: Secondary | ICD-10-CM

## 2024-04-09 DIAGNOSIS — R42 Dizziness and giddiness: Secondary | ICD-10-CM | POA: Insufficient documentation

## 2024-04-09 LAB — CBC WITH DIFFERENTIAL/PLATELET
Abs Immature Granulocytes: 0.02 10*3/uL (ref 0.00–0.07)
Basophils Absolute: 0 10*3/uL (ref 0.0–0.1)
Basophils Relative: 1 %
Eosinophils Absolute: 0.1 10*3/uL (ref 0.0–0.5)
Eosinophils Relative: 1 %
HCT: 27.2 % — ABNORMAL LOW (ref 36.0–46.0)
Hemoglobin: 7.3 g/dL — ABNORMAL LOW (ref 12.0–15.0)
Immature Granulocytes: 0 %
Lymphocytes Relative: 21 %
Lymphs Abs: 1.7 10*3/uL (ref 0.7–4.0)
MCH: 15.8 pg — ABNORMAL LOW (ref 26.0–34.0)
MCHC: 26.8 g/dL — ABNORMAL LOW (ref 30.0–36.0)
MCV: 58.9 fL — ABNORMAL LOW (ref 80.0–100.0)
Monocytes Absolute: 0.6 10*3/uL (ref 0.1–1.0)
Monocytes Relative: 7 %
Neutro Abs: 5.9 10*3/uL (ref 1.7–7.7)
Neutrophils Relative %: 70 %
Platelets: 380 10*3/uL (ref 150–400)
RBC: 4.62 MIL/uL (ref 3.87–5.11)
RDW: 20.7 % — ABNORMAL HIGH (ref 11.5–15.5)
WBC: 8.4 10*3/uL (ref 4.0–10.5)
nRBC: 0 % (ref 0.0–0.2)

## 2024-04-09 LAB — HEMOGLOBIN, FINGERSTICK: Hemoglobin: 6.7 g/dL — CL (ref 11.1–15.9)

## 2024-04-09 LAB — COMPREHENSIVE METABOLIC PANEL WITH GFR
ALT: 31 U/L (ref 0–44)
AST: 34 U/L (ref 15–41)
Albumin: 3.5 g/dL (ref 3.5–5.0)
Alkaline Phosphatase: 51 U/L (ref 38–126)
Anion gap: 12 (ref 5–15)
BUN: 11 mg/dL (ref 6–20)
CO2: 23 mmol/L (ref 22–32)
Calcium: 9.1 mg/dL (ref 8.9–10.3)
Chloride: 102 mmol/L (ref 98–111)
Creatinine, Ser: 0.55 mg/dL (ref 0.44–1.00)
GFR, Estimated: >60 mL/min (ref >60)
Glucose, Bld: 114 mg/dL — ABNORMAL HIGH (ref 70–99)
Potassium: 3.7 mmol/L (ref 3.5–5.1)
Sodium: 137 mmol/L (ref 135–145)
Total Bilirubin: 0.5 mg/dL (ref 0.0–1.2)
Total Protein: 7.5 g/dL (ref 6.5–8.1)

## 2024-04-09 LAB — ABO/RH: ABO/RH(D): B POS

## 2024-04-09 LAB — HCG, QUANTITATIVE, PREGNANCY: hCG, Beta Chain, Quant, S: 286 m[IU]/mL — ABNORMAL HIGH (ref <5)

## 2024-04-09 LAB — PREPARE RBC (CROSSMATCH)

## 2024-04-09 MED ORDER — SODIUM CHLORIDE 0.9% IV SOLUTION: Freq: Once | INTRAVENOUS | Status: DC

## 2024-04-09 NOTE — ED Triage Notes (Addendum)
 Pt sent from pcp for abnormal labs, Hgb of 6.7. Endorse symptoms of weakness, dizziness, headaches, and states she's been seeing black dots in her vision. Denies CP and SOB. Pt AAOx4 and Ambulatory. Symptoms have been lasting for about 5 days. Recently seen at [facility] ED. Pt states that she's been menstrual bleeding since the 24th, but says it's not a concerning amount.

## 2024-04-09 NOTE — ED Provider Notes (Signed)
 [place] EMERGENCY DEPARTMENT AT [facility] Provider Note   CSN: [id number] Arrival date & time: 04/09/24  1414     Patient presents with: Abnormal labs and Dizziness   Margaret Juarez is a 28 y.o. female.   Patient complains of vaginal bleeding, dizziness and weakness.  Patient had a medical abortion last week.  She has a history of anemia.  Patient reports that she is having continued bleeding and has begun experiencing dizziness and fatigue.  Patient reports that she is scheduled to see hematology for anemia.  Patient states that she was told her hCG was it was 2000.  Patient's records show 5 days ago her hCG was 2589.  Patient was seen at her primary care doctor and her hemoglobin was 6.3.  Patient was sent here for evaluation and possible blood transfusion due to weakness.   Dizziness      Prior to Admission medications   Medication Sig Start Date End Date Taking? Authorizing Provider  albuterol  (VENTOLIN  HFA) 108 (90 Base) MCG/ACT inhaler Inhale 2 puffs into the lungs every 6 (six) hours as needed for wheezing or shortness of breath. 03/19/23   Kennyth Domino, FNP  atenolol  (TENORMIN ) 25 MG tablet Take 1 tablet (25 mg total) by mouth at bedtime. 12/10/23   Shamleffer, Ibtehal Jaralla, MD  fluticasone  (FLONASE ) 50 MCG/ACT nasal spray Place 2 sprays into both nostrils daily. 03/19/23   Kennyth Domino, FNP  LORYNA 3-0.02 MG tablet Take 1 tablet by mouth daily. Patient not taking: Reported on 04/09/2024 01/29/24   [provider]  methimazole  (TAPAZOLE ) 5 MG tablet Take 1 tablet (5 mg total) by mouth daily. 12/11/23   Shamleffer, Ibtehal Jaralla, MD  Semaglutide -Weight Management 1.7 MG/0.75ML SOAJ Inject 1.7 mg into the skin once a week for 28 days. 04/05/24 05/03/24  Joesph Annabella HERO, FNP  Semaglutide -Weight Management 2.4 MG/0.75ML SOAJ Inject 2.4 mg into the skin once a week for 28 days. 05/04/24 06/01/24  Joesph Annabella HERO, FNP    Allergies: Mangifera indica, Mango  butter, and Cephalexin    Review of Systems  Neurological:  Positive for dizziness.  All other systems reviewed and are negative.   Updated Vital Signs BP 121/78   Pulse 79   Temp 98.4 F (36.9 C) (Oral)   Resp 17   Ht 5' 9 (1.753 m)   Wt 94.8 kg   SpO2 98%   BMI 30.86 kg/m   Physical Exam Vitals and nursing note reviewed.  Constitutional:      Appearance: She is well-developed.  HENT:     Head: Normocephalic.   Cardiovascular:     Rate and Rhythm: Normal rate.  Pulmonary:     Effort: Pulmonary effort is normal.  Abdominal:     General: There is no distension.   Musculoskeletal:        General: Normal range of motion.     Cervical back: Normal range of motion.   Skin:    General: Skin is warm.   Neurological:     General: No focal deficit present.     Mental Status: She is alert and oriented to person, place, and time.     (all labs ordered are listed, but only abnormal results are displayed) Labs Reviewed  CBC WITH DIFFERENTIAL/PLATELET - Abnormal; Notable for the following components:      Result Value   Hemoglobin 7.3 (*)    HCT 27.2 (*)    MCV 58.9 (*)    MCH 15.8 (*)  MCHC 26.8 (*)    RDW 20.7 (*)    All other components within normal limits  COMPREHENSIVE METABOLIC PANEL WITH GFR - Abnormal; Notable for the following components:   Glucose, Bld 114 (*)    All other components within normal limits  HCG, QUANTITATIVE, PREGNANCY - Abnormal; Notable for the following components:   hCG, Beta Chain, Quant, S 286 (*)    All other components within normal limits  PREPARE RBC (CROSSMATCH)  TYPE AND SCREEN  ABO/RH    EKG: None  Radiology: No results found.   Procedures   Medications Ordered in the ED  0.9 %  sodium chloride infusion (Manually program via Guardrails IV Fluids) (0 mLs Intravenous Hold 04/09/24 1554)                                    Medical Decision Making Patient has a history of anemia.  Patient recently had a medical  abortion and is having vaginal bleeding.  Her primary care physician was concerned because her hemoglobin dropped to 6.3.  Amount and/or Complexity of Data Reviewed External Data Reviewed: notes.    Details: Primary care notes reviewed Labs: ordered. Decision-making details documented in ED Course.    Details: Labs ordered reviewed and interpreted.  Patient's hemoglobin is 7.3.  Quantitative hCG is 286.  Risk Prescription drug management. Risk Details: Patient is symptomatic from her anemia.  She is having current vaginal bleeding from medical abortion.  Patient is given 1 unit of blood IV.  Patient reports feeling well.  Patient is advised to keep follow-up with her physician for recheck.  Patient's hCG today is 286 this is decreased from 5 days ago when it was 2589.  Patient is advised she should return for recheck if she has increased weakness or increased bleeding.  Patient is discharged in stable condition.        Final diagnoses:  Anemia, unspecified type  Vaginal bleeding    ED Discharge Orders     None       An After Visit Summary was printed and given to the patient.    Flint Sonny MARLA DEVONNA 04/09/24 ELWOOD Freddi Hamilton, MD 04/11/24 1040

## 2024-04-09 NOTE — Progress Notes (Signed)
 Established Patient Office Visit  Subjective   Patient ID: Margaret Juarez, female    DOB: 1996/09/14  Age: 28 y.o. MRN: [id number]  Chief Complaint  Patient presents with   Medical Management of Chronic Issues    HPI Margaret Juarez is here for an ER follow up. She was seen in the ER at [facility] on 04/04/24 for palpitations.   Per ER discharge summary:  Emergency Department Provider Note  ED Clinical Impression   Final diagnoses:  Status post drug-induced abortion (Primary)  Palpitations  Graves disease  Anemia, unspecified type   ED Assessment/Plan   History   Chief Complaint  Patient presents with  Heart Palpitations   HPI  2220 hours. Patient 28 year old female complex prior medical history including Graves' disease treated with radioiodine treatment many years ago she took misoprostol and today to terminate a 5-week pregnancy subsequently has nauseated and tachycardia she took the beta-blockers now that is improved by exam she [place] cooperative respiratory general neuroexam negative abdomen benign. Attritional rectal be reviewed on the balance it sounds like she has symptoms perhaps residual from her Graves' disease which were exacerbated by her abortion termination today. Will go ahead and get a baseline beta-hCG which we can trend I will refer back to OB/GYN otherwise we will plan for also long-term referral back to [facility] endocrinology.  Reports some dizziness since discharge. She denies palpitations, weakness, shortness of breath, LOC, syncope, or presyncope. Pregnancy was not planned. She stopped taking her birth control because it was making her sick. She has not scheduled a follow up with OB. She is having a light flow for the last few days. Denies pain or fever.  She is taking an iron supplement once a day. Occasional dizziness with flushing. Denies LOC, chest pain, shortness of breath, palpitations. Reports she hasn't responded to oral iron  supplements in the past. She has been referred to hematology but her first appt isn't until January. She is on the cancellation list.   Has appt with endo next week for management of hyperthyroidism.  Currently on 1 mg of wegovy . She denies side effects. She is eating a well balanced diet. She is walking and running daily.      ROS As per HPI.    Objective:     BP 122/72   Pulse 80   Temp 97.7 F (36.5 C) (Temporal)   Ht 5' 9 (1.753 m)   Wt 209 lb 9.6 oz (95.1 kg)   SpO2 98%   BMI 30.95 kg/m  Wt Readings from Last 3 Encounters:  04/09/24 209 lb 9.6 oz (95.1 kg)  01/05/24 218 lb (98.9 kg)  12/10/23 216 lb (98 kg)      Physical Exam Vitals and nursing note reviewed.  Constitutional:      General: She is not in acute distress.    Appearance: She is not ill-appearing, toxic-appearing or diaphoretic.   Cardiovascular:     Rate and Rhythm: Normal rate and regular rhythm.     Heart sounds: Normal heart sounds. No murmur heard. Pulmonary:     Effort: Pulmonary effort is normal. No respiratory distress.     Breath sounds: Normal breath sounds. No wheezing, rhonchi or rales.   Musculoskeletal:     Right lower leg: No edema.     Left lower leg: No edema.   Skin:    General: Skin is warm and dry.   Neurological:     General: No focal deficit present.  Mental Status: She is alert and oriented to person, place, and time.   Psychiatric:        Mood and Affect: Mood normal.        Behavior: Behavior normal.      No results found for any visits on 04/09/24.    The ASCVD Risk score (Arnett DK, et al., 2019) failed to calculate for the following reasons:   The 2019 ASCVD risk score is only valid for ages 65 to 27    Assessment & Plan:   Margaret Juarez was seen today for medical management of chronic issues.  Diagnoses and all orders for this visit:  Status post drug-induced abortion -     Anemia Profile B -     BMP8+EGFR  Iron deficiency anemia, unspecified  iron deficiency anemia type -     Anemia Profile B -     Hemoglobin, fingerstick  Hyperthyroidism -     Cancel: TSH  Class 1 obesity due to excess calories without serious comorbidity with body mass index (BMI) of 30.0 to 30.9 in adult -     BMP8+EGFR  POC hemoglobin is critically low at 6.7 today with continued vaginal bleeding post drug-induced abortion. Patient instructed to go to ER today for blood transfusion and further evaluation. Discussed importance of follow up with OB, hematology, and endocrinology. She will proceed to the ER in her personal vehicle- minimally symptomatic.   The patient indicates understanding of these issues and agrees with the plan.  Annabella CHRISTELLA Search, FNP

## 2024-04-09 NOTE — ED Notes (Signed)
 Will start in FT until a room opens up in the back.

## 2024-04-09 NOTE — ED Notes (Signed)
 Pt/family received d/c paperwork at this time. After going over the paperwork any questions, comments, or concerns were answered to the best of this nurse's knowledge. The pt/family verbally acknowledged the teachings/instructions.

## 2024-04-09 NOTE — Discharge Instructions (Addendum)
 Follow up with your primary care provider for recheck.  Return if any problems.  Follow up with Hematology as scheduled.  Continue taking iron supplement

## 2024-04-10 LAB — BMP8+EGFR
BUN/Creatinine Ratio: 14 (ref 9–23)
BUN: 9 mg/dL (ref 6–20)
CO2: 19 mmol/L — ABNORMAL LOW (ref 20–29)
Calcium: 9.4 mg/dL (ref 8.7–10.2)
Chloride: 102 mmol/L (ref 96–106)
Creatinine, Ser: 0.64 mg/dL (ref 0.57–1.00)
Glucose: 124 mg/dL — ABNORMAL HIGH (ref 70–99)
Potassium: 4.4 mmol/L (ref 3.5–5.2)
Sodium: 139 mmol/L (ref 134–144)
eGFR: 124 mL/min/{1.73_m2} (ref >59)

## 2024-04-10 LAB — ANEMIA PROFILE B
Basophils Absolute: 0 10*3/uL (ref 0.0–0.2)
Basos: 1 %
EOS (ABSOLUTE): 0.1 10*3/uL (ref 0.0–0.4)
Eos: 2 %
Ferritin: 5 ng/mL — ABNORMAL LOW (ref 15–150)
Folate: 6 ng/mL (ref >3.0)
Hematocrit: 28.1 % — ABNORMAL LOW (ref 34.0–46.6)
Hemoglobin: 7.2 g/dL — CL (ref 11.1–15.9)
Immature Grans (Abs): 0 10*3/uL (ref 0.0–0.1)
Immature Granulocytes: 0 %
Iron Saturation: 2 % — CL (ref 15–55)
Iron: 12 ug/dL — ABNORMAL LOW (ref 27–159)
Lymphocytes Absolute: 1.5 10*3/uL (ref 0.7–3.1)
Lymphs: 23 %
MCH: 15.8 pg — ABNORMAL LOW (ref 26.6–33.0)
MCHC: 25.6 g/dL — ABNORMAL LOW (ref 31.5–35.7)
MCV: 62 fL — ABNORMAL LOW (ref 79–97)
Monocytes Absolute: 0.4 10*3/uL (ref 0.1–0.9)
Monocytes: 7 %
Neutrophils Absolute: 4.4 10*3/uL (ref 1.4–7.0)
Neutrophils: 67 %
Platelets: 358 10*3/uL (ref 150–450)
RBC: 4.57 x10E6/uL (ref 3.77–5.28)
RDW: 18.3 % — ABNORMAL HIGH (ref 11.7–15.4)
Retic Ct Pct: 2.4 % (ref 0.6–2.6)
Total Iron Binding Capacity: 513 ug/dL — ABNORMAL HIGH (ref 250–450)
UIBC: 501 ug/dL — ABNORMAL HIGH (ref 131–425)
Vitamin B-12: 618 pg/mL (ref 232–1245)
WBC: 6.4 10*3/uL (ref 3.4–10.8)

## 2024-04-12 ENCOUNTER — Telehealth: Payer: Self-pay

## 2024-04-12 ENCOUNTER — Telehealth: Payer: Self-pay | Admitting: Family Medicine

## 2024-04-12 ENCOUNTER — Ambulatory Visit: Payer: Self-pay | Admitting: Family Medicine

## 2024-04-12 NOTE — Telephone Encounter (Signed)
 Copied from CRM [id number]. Topic: Clinical - Lab/Test Results >> Apr 12, 2024  2:35 PM Montie POUR wrote: Reason for CRM:  Ms. Vargo called back and I read chart note dated today from [facility]. She did not have and questions and has an appointment with FNP Joesph on July 25 and she will make appointment with her OB.

## 2024-04-12 NOTE — Telephone Encounter (Signed)
 Jerrod calling from [facility] with critical lab results. Hemoglobin 7.2 collected on 04/09/2024

## 2024-04-12 NOTE — Telephone Encounter (Signed)
 Noted. Patient was seen in ER for treatment.

## 2024-04-13 ENCOUNTER — Ambulatory Visit: Admitting: Internal Medicine

## 2024-04-13 LAB — TYPE AND SCREEN
ABO/RH(D): B POS
Antibody Screen: NEGATIVE
Unit division: 0
Unit division: 0

## 2024-04-13 LAB — BPAM RBC
Blood Product Expiration Date: 202507012359
Blood Product Expiration Date: 202507112359
ISSUE DATE / TIME: 202506271648
Unit Type and Rh: 1700
Unit Type and Rh: 5100

## 2024-04-13 NOTE — Progress Notes (Deleted)
 Name: Margaret Juarez  MRN/ DOB: [id number], [birth year]    Age/ Sex: 28 y.o., female    PCP: Joesph Annabella HERO, FNP   Reason for Endocrinology Evaluation: Hyperthyroidism     Date of Initial Endocrinology Evaluation: 12/10/2023    HPI: [name] is a 28 y.o. female with a past medical history of Hyperthyroidism. The patient presented for initial endocrinology clinic visit on 12/10/2023 for consultative assistance with her Hyperthyroidism.     She was diagnosed with hyperthyroidism in 2016 during evaluation for palpitations    The patient was noted with a suppressed TSH of <0.01 uIU/mL in 2019 with normal free T3 and free T4.  She was pregnant at the time  Patient had overt hyperthyroidism in 2022, with a suppressed TSH 0.005 uIU/mL, elevated free T4 at 2.61 IU/mL, elevated free T3 at 7.5 pg/mL  Of note, patient had elevated anti-TPO antibodies at 105 IU/mL as well as thyroglobulin antibodies 02/2021.  Thyroid  uptake and scan 11/18/2016 showed significantly elevated 24-hour radioiodine uptake of 71%, findings consistent with Graves' disease  The patient established with Dr. 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     Has been noted with weight gain  Has occasional local neck swelling  Has been having low energy and fatigue  Denies constipation or diarrhea  Continues with tremors  Continues with palpitations   Atenolol  25 mg at bedtime Methimazole  5 mg daily        HISTORY:  Past Medical History:  Past Medical History:  Diagnosis Date   Asthma    Chronic pansinusitis    Graves disease 2018   Von Willebrand disease (HCC)    Past Surgical History:  Past Surgical History:  Procedure Laterality Date   WISDOM TOOTH EXTRACTION      Social History:  reports that she has never smoked. She has never used smokeless tobacco.  Drug: Marijuana. She reports that she does not drink alcohol. Family History: family history includes Breast cancer in her paternal aunt; Diabetes in her father; Heart disease in her father; Osteoarthritis in her father, mother, and paternal aunt.   HOME MEDICATIONS: Allergies as of 04/13/2024       Reactions   Mangifera Indica Anaphylaxis   Mango Butter    Cephalexin Hives        Medication List        Accurate as of April 13, 2024  7:27 AM. If you have any questions, ask your nurse or doctor.          albuterol  108 (90 Base) MCG/ACT inhaler Commonly known as: VENTOLIN  HFA Inhale 2 puffs into the lungs every 6 (six) hours as needed for wheezing or shortness of breath.   atenolol  25 MG tablet Commonly known as: TENORMIN  Take 1 tablet (25 mg total) by mouth at bedtime.   fluticasone  50 MCG/ACT nasal spray Commonly known as: FLONASE  Place 2 sprays into both nostrils daily.   Loryna 3-0.02  MG tablet Generic drug: drospirenone-ethinyl estradiol  Take 1 tablet by mouth daily.   methimazole  5 MG tablet Commonly known as: TAPAZOLE  Take 1 tablet (5 mg total) by mouth daily.   Semaglutide -Weight Management 1.7 MG/0.75ML Soaj Inject 1.7 mg into the skin once a week for 28 days.   Semaglutide -Weight Management 2.4 MG/0.75ML Soaj Inject 2.4 mg into the skin once a week for 28 days. Start taking on: May 04, 2024          REVIEW OF SYSTEMS: A comprehensive ROS was conducted with the patient and is negative except as per HPI     OBJECTIVE:  VS: There were no vitals taken for this visit.   Wt  Readings from Last 3 Encounters:  04/09/24 209 lb (94.8 kg)  04/09/24 209 lb 9.6 oz (95.1 kg)  01/05/24 218 lb (98.9 kg)     EXAM: General: Pt appears well and is in NAD  Eyes: External eye exam normal without stare, lid lag or exophthalmos.  EOM intact.  PERRL.  Neck: General: Supple without adenopathy. Thyroid : Thyroid  size normal.  No goiter or nodules appreciated. No thyroid  bruit.  Lungs: Clear with good BS bilat   Heart: Auscultation: RRR.  Abdomen: Soft, nontender  Extremities:  BL LE: No pretibial edema   Mental Status: Judgment, insight: Intact Orientation: Oriented to time, place, and person Mood and affect: No depression, anxiety, or agitation     DATA REVIEWED:     Latest Reference Range & Units 12/10/23 10:31  TSH mIU/L 0.01 (L)  Triiodothyronine,Free,Serum 2.3 - 4.2 pg/mL 4.0  T4,Free(Direct) 0.8 - 1.8 ng/dL 1.5    Thyroid  Uptake and Scan 11/19/2016 FINDINGS: 24 hour radio iodine uptake is calculated at 71%, well above normal range consistent with hyperthyroidism.   Images of the thyroid  gland in 3 projections demonstrate homogeneous diffusely increased tracer localization throughout both thyroid  lobes.   No definite areas of increased or decreased tracer localization seen.   IMPRESSION: Significantly elevated 24 hour radio iodine uptake of 71%.   Homogeneous increased tracer distribution throughout both thyroid  lobes.   Findings consistent with Graves disease.      ASSESSMENT/PLAN/RECOMMENDATIONS:   Hyperthyroidism  -Patient is clinically euthyroid -Has occasional local neck swelling -Has been on and off methimazole  over the years   Medications : Atenolol  25 mg at bedtime  Methimazole  5 mg daily     2. Graves' Disease:  -Patient with burning and itching of the eyes   Follow-up in 3 months  Signed electronically by: Stefano Redgie Butts, MD  [facility] Endocrinology  [facility] [address] [place] [place], [place] [address] Phone: [phone] FAX: [phone]   CC: Joesph Annabella HERO, FNP [address] [place] [address] Phone: [phone] Fax: [phone]   Return to Endocrinology clinic as below: Future Appointments  Date Time Provider Department Center  04/13/2024 10:50 AM Mivaan Corbitt, Donell Redgie, MD [facility] None  05/07/2024  1:30 PM Joesph Annabella HERO, FNP [facility] None  01/05/2025 10:00 AM Joesph Annabella HERO, FNP [facility] None

## 2024-04-19 DIAGNOSIS — N912 Amenorrhea, unspecified: Secondary | ICD-10-CM | POA: Diagnosis not present

## 2024-04-20 ENCOUNTER — Telehealth: Payer: Self-pay | Admitting: Internal Medicine

## 2024-04-20 ENCOUNTER — Telehealth (INDEPENDENT_AMBULATORY_CARE_PROVIDER_SITE_OTHER): Admitting: Internal Medicine

## 2024-04-20 DIAGNOSIS — E0500 Thyrotoxicosis with diffuse goiter without thyrotoxic crisis or storm: Secondary | ICD-10-CM | POA: Diagnosis not present

## 2024-04-20 DIAGNOSIS — E0590 Thyrotoxicosis, unspecified without thyrotoxic crisis or storm: Secondary | ICD-10-CM

## 2024-04-20 NOTE — Telephone Encounter (Signed)
 Please contact the patient and schedule her for a follow-up visit with me in 3 months   Thanks

## 2024-04-20 NOTE — Progress Notes (Signed)
 Virtual Visit via Video Note  I connected with Margaret Juarez on 04/20/24 at  2:40 PM EDT by a video enabled telemedicine application and verified that I am speaking with the correct person using two identifiers.   I discussed the limitations of evaluation and management by telemedicine and the availability of in person appointments. The patient expressed understanding and agreed to proceed.   -Location of the patient : Home -Location of the provider : Office -The names of all persons participating in the telemedicine service : Pt and myself         Name: Margaret Juarez  MRN/ DOB: [id number], July 20, 1996    Age/ Sex: 28 y.o., female    PCP: Joesph Annabella HERO, FNP   Reason for Endocrinology Evaluation: Hyperthyroidism     Date of Initial Endocrinology Evaluation: 12/10/2023    HPI: [name] is a 28 y.o. female with a past medical history of Hyperthyroidism. The patient presented for initial endocrinology clinic visit on 12/10/2023 for consultative assistance with her Hyperthyroidism.     She was diagnosed with hyperthyroidism in 2016 during evaluation for palpitations    The patient was noted with a suppressed TSH of <0.01 uIU/mL in 2019 with normal free T3 and free T4.  She was pregnant at the time  Patient had overt hyperthyroidism in 2022, with a suppressed TSH 0.005 uIU/mL, elevated free T4 at 2.61 IU/mL, elevated free T3 at 7.5 pg/mL  Of note, patient had elevated anti-TPO antibodies at 105 IU/mL as well as thyroglobulin antibodies 02/2021.  Thyroid  uptake and scan 11/18/2016 showed significantly elevated 24-hour radioiodine uptake of 71%, findings consistent with Graves' disease  The patient established with Dr. Lenis [facility] endocrinology) in 2019, she was lost to follow-up from 2022 until establishing care at our clinic in 2025  The patient was prescribed methimazole  at some point     Unable to tolerate COC's due to GI side effects  Follows  with Gynecology for dysfunctional uterine bleeding    No Fh of thyroid  disease   She was started on atenolol  and methimazole  on her initial visit to our clinic 11/2023  TRAb was elevated at 2.46 11/2023  SUBJECTIVE:    Today (04/20/24):  Margaret Juarez is here for follow-up on hyperthyroidism.   She is s/p abortion 03/2024 Patient required blood transfusion 04/09/2024  She has noted weight gain while on methimazole , she was started on Wegovy  through her PCPs office and has been losing weight Denies any local neck swelling No constipation or diarrhea Tremors have been improving Palpitations have been improving Denies eye symptoms  Atenolol  25 mg daily Methimazole  5 mg daily       HISTORY:  Past Medical History:  Past Medical History:  Diagnosis Date   Asthma    Chronic pansinusitis    Graves disease 2018   Von Willebrand disease (HCC)    Past Surgical History:  Past Surgical History:  Procedure Laterality Date   WISDOM TOOTH EXTRACTION      Social History:  reports that she has never smoked. She has never used smokeless tobacco.  Drug: Marijuana. She reports that she does not drink alcohol. Family History: family history includes Breast cancer in her paternal aunt; Diabetes in her father; Heart disease in her father; Osteoarthritis in her father, mother, and paternal aunt.   HOME MEDICATIONS: Allergies as of 04/20/2024       Reactions   Mangifera Indica Anaphylaxis   Mango Butter    Cephalexin Hives  Medication List        Accurate as of April 20, 2024  1:18 PM. If you have any questions, ask your nurse or doctor.          albuterol  108 (90 Base) MCG/ACT inhaler Commonly known as: VENTOLIN  HFA Inhale 2 puffs into the lungs every 6 (six) hours as needed for wheezing or shortness of breath.   atenolol  25 MG tablet Commonly known as: TENORMIN  Take 1 tablet (25 mg total) by mouth at bedtime.   fluticasone  50 MCG/ACT nasal spray Commonly  known as: FLONASE  Place 2 sprays into both nostrils daily.   Loryna 3-0.02 MG tablet Generic drug: drospirenone-ethinyl estradiol  Take 1 tablet by mouth daily.   methimazole  5 MG tablet Commonly known as: TAPAZOLE  Take 1 tablet (5 mg total) by mouth daily.   Semaglutide -Weight Management 1.7 MG/0.75ML Soaj Inject 1.7 mg into the skin once a week for 28 days.   Semaglutide -Weight Management 2.4 MG/0.75ML Soaj Inject 2.4 mg into the skin once a week for 28 days. Start taking on: May 04, 2024          REVIEW OF SYSTEMS: A comprehensive ROS was conducted with the patient and is negative except as per HPI     OBJECTIVE:  VS: There were no vitals taken for this visit.   Wt Readings from Last 3 Encounters:  04/09/24 209 lb (94.8 kg)  04/09/24 209 lb 9.6 oz (95.1 kg)  01/05/24 218 lb (98.9 kg)     EXAM: General: Pt appears well and is in NAD  Eyes: External eye exam normal without stare, lid lag or exophthalmos.  EOM intact.  PERRL.  Mental Status: Judgment, insight: Intact Orientation: Oriented to time, place, and person Mood and affect: No depression, anxiety, or agitation     DATA REVIEWED:     Latest Reference Range & Units 12/10/23 10:31  TSH mIU/L 0.01 (L)  Triiodothyronine,Free,Serum 2.3 - 4.2 pg/mL 4.0  T4,Free(Direct) 0.8 - 1.8 ng/dL 1.5    Latest Reference Range & Units 12/10/23 10:31  TRAB <=2.00 IU/L 2.46 (H)    Thyroid  Uptake and Scan 11/19/2016 FINDINGS: 24 hour radio iodine uptake is calculated at 71%, well above normal range consistent with hyperthyroidism.   Images of the thyroid  gland in 3 projections demonstrate homogeneous diffusely increased tracer localization throughout both thyroid  lobes.   No definite areas of increased or decreased tracer localization seen.   IMPRESSION: Significantly elevated 24 hour radio iodine uptake of 71%.   Homogeneous increased tracer distribution throughout both thyroid  lobes.   Findings consistent  with Graves disease.      ASSESSMENT/PLAN/RECOMMENDATIONS:   Hyperthyroidism  -Patient is clinically euthyroid - Lab order for TFTs will be faxed to [facility] in [place] - No local neck symptoms - She does forget to take methimazole  on average once weekly - She prefers to take atenolol  during the day rather than at bedtime - Will await lab results prior to making any adjustments  Medications : Atenolol  25 mg daily Methimazole  5 mg daily     2. Graves' Disease:  - I explained to the patient that Graves' disease is a hereditary condition - She has no eye symptoms - Discussed the importance of notifying ophthalmologist regarding the presence of Graves' disease  Follow-up in 3 months  Signed electronically by: Stefano Redgie Butts, MD  [facility] Endocrinology  [facility] [address] [place] [place], [place] [address] Phone: [phone] FAX: [phone]   CC: Joesph Annabella HERO,  FNP [address] [place] [address] Phone: [phone] Fax: [phone]   Return to Endocrinology clinic as below: Future Appointments  Date Time Provider Department Center  04/20/2024  2:40 PM Sama Arauz, Donell Cardinal, MD [facility] None  05/07/2024  1:30 PM Joesph Annabella HERO, FNP [facility] None  01/05/2025 10:00 AM Joesph Annabella HERO, FNP [facility] None

## 2024-04-23 ENCOUNTER — Ambulatory Visit: Admitting: Internal Medicine

## 2024-05-07 ENCOUNTER — Ambulatory Visit: Admitting: Family Medicine

## 2024-05-10 ENCOUNTER — Encounter: Payer: Self-pay | Admitting: Family Medicine

## 2024-05-12 DIAGNOSIS — M9914 Subluxation complex (vertebral) of sacral region: Secondary | ICD-10-CM | POA: Diagnosis not present

## 2024-05-12 DIAGNOSIS — M9915 Subluxation complex (vertebral) of pelvic region: Secondary | ICD-10-CM | POA: Diagnosis not present

## 2024-05-12 DIAGNOSIS — M9912 Subluxation complex (vertebral) of thoracic region: Secondary | ICD-10-CM | POA: Diagnosis not present

## 2024-05-12 DIAGNOSIS — M9913 Subluxation complex (vertebral) of lumbar region: Secondary | ICD-10-CM | POA: Diagnosis not present

## 2024-05-21 ENCOUNTER — Ambulatory Visit: Admitting: Family Medicine

## 2024-05-24 ENCOUNTER — Ambulatory Visit: Admitting: Family Medicine

## 2024-05-24 DIAGNOSIS — M9915 Subluxation complex (vertebral) of pelvic region: Secondary | ICD-10-CM | POA: Diagnosis not present

## 2024-05-24 DIAGNOSIS — M9912 Subluxation complex (vertebral) of thoracic region: Secondary | ICD-10-CM | POA: Diagnosis not present

## 2024-05-24 DIAGNOSIS — M9914 Subluxation complex (vertebral) of sacral region: Secondary | ICD-10-CM | POA: Diagnosis not present

## 2024-05-24 DIAGNOSIS — M9913 Subluxation complex (vertebral) of lumbar region: Secondary | ICD-10-CM | POA: Diagnosis not present

## 2024-06-03 ENCOUNTER — Encounter: Payer: Self-pay | Admitting: Family Medicine

## 2024-06-03 ENCOUNTER — Ambulatory Visit: Admitting: Family Medicine

## 2024-06-03 VITALS — BP 117/79 | HR 86 | Temp 98.3°F | Ht 69.0 in | Wt 213.0 lb

## 2024-06-03 DIAGNOSIS — E0590 Thyrotoxicosis, unspecified without thyrotoxic crisis or storm: Secondary | ICD-10-CM

## 2024-06-03 DIAGNOSIS — F411 Generalized anxiety disorder: Secondary | ICD-10-CM | POA: Diagnosis not present

## 2024-06-03 DIAGNOSIS — D509 Iron deficiency anemia, unspecified: Secondary | ICD-10-CM | POA: Diagnosis not present

## 2024-06-03 DIAGNOSIS — E66811 Obesity, class 1: Secondary | ICD-10-CM | POA: Diagnosis not present

## 2024-06-03 DIAGNOSIS — Z6831 Body mass index (BMI) 31.0-31.9, adult: Secondary | ICD-10-CM

## 2024-06-03 DIAGNOSIS — E6609 Other obesity due to excess calories: Secondary | ICD-10-CM

## 2024-06-03 MED ORDER — FLUOXETINE HCL 20 MG PO CAPS: 20.0000 mg | ORAL_CAPSULE | Freq: Every day | ORAL | 1 refills | Status: DC

## 2024-06-03 NOTE — Progress Notes (Signed)
 Established Patient Office Visit  Subjective   Patient ID: Margaret Juarez, female    DOB: 09/15/1996  Age: 28 y.o. MRN: [id number]  Chief Complaint  Patient presents with   Medical Management of Chronic Issues    HPI   History of Present Illness   Margaret Juarez is a 28 year old female who presents for follow-up after a blood transfusion.  Post-transfusion symptoms and iron supplementation - Follow-up after blood transfusion 2 months ago - Did not have hospital follow up - Feels palpitations sometimes - Denies shortness of breath, dizziness, chest pain, syncope - Taking iron supplement twice daily - Nausea associated with iron supplementation - Attempts to alleviate nausea by taking iron with food  Anxiety and tachycardia - Increased anxiety since blood transfusion - Anxiety exacerbated by daughter's upcoming school start and previous medical incident - Previously used hydroxyzine and Buspar , discontinued due to side effects  Hyperthyroidism - Had virtual appointment with endocrinology - Labs are pending - Endocrinology will adjust medication pending labs - She does endorse palpitations, tachycardia - Compliant with atenolol  nightly   Gastrointestinal symptoms related to wegovy  - Discontinued Wegovy  in July due to severe nausea, vomiting, and right-sided abdominal pain - No further gastrointestinal symptoms since discontinuation - Maintains stable weight - Focusing on healthy diet and increasing physical activity          06/03/2024   10:56 AM 04/09/2024   11:11 AM 01/05/2024   10:09 AM  Depression screen PHQ 2/9  Decreased Interest 0 0 0  Down, Depressed, Hopeless 0 0 0  PHQ - 2 Score 0 0 0  Altered sleeping 0 3 0  Tired, decreased energy 1 3 0  Change in appetite 0 0 0  Feeling bad or failure about yourself  0 0 0  Trouble concentrating 1 0 0  Moving slowly or fidgety/restless 0 0 0  Suicidal thoughts 0 0 0  PHQ-9 Score 2 6 0  Difficult doing  work/chores Somewhat difficult Somewhat difficult Not difficult at all      06/03/2024   10:57 AM 04/09/2024   11:12 AM 01/05/2024   10:09 AM 10/31/2022    1:37 PM  GAD 7 : Generalized Anxiety Score  Nervous, Anxious, on Edge 3 1 1  0  Control/stop worrying 3 1 1  0  Worry too much - different things 3 1 1  0  Trouble relaxing 2 1 1 1   Restless 1 1 0 0  Easily annoyed or irritable 1 0 1 0  Afraid - awful might happen 1 0 1 0  Total GAD 7 Score 14 5 6 1   Anxiety Difficulty Somewhat difficult Somewhat difficult Not difficult at all Not difficult at all        ROS As per HPI.   Objective:     BP 117/79   Pulse 86   Temp 98.3 F (36.8 C) (Temporal)   Ht 5' 9 (1.753 m)   Wt 213 lb (96.6 kg)   SpO2 99%   BMI 31.45 kg/m  Wt Readings from Last 3 Encounters:  06/03/24 213 lb (96.6 kg)  04/09/24 209 lb (94.8 kg)  04/09/24 209 lb 9.6 oz (95.1 kg)      Physical Exam Vitals and nursing note reviewed.  Constitutional:      General: She is not in acute distress.    Appearance: Normal appearance. She is not ill-appearing, toxic-appearing or diaphoretic.  Cardiovascular:     Rate and Rhythm: Normal rate and regular rhythm.  Pulses: Normal pulses.     Heart sounds: Normal heart sounds. No murmur heard. Pulmonary:     Effort: Pulmonary effort is normal. No respiratory distress.     Breath sounds: Normal breath sounds.  Musculoskeletal:     Right lower leg: No edema.     Left lower leg: No edema.  Skin:    General: Skin is warm and dry.  Neurological:     General: No focal deficit present.     Mental Status: She is alert and oriented to person, place, and time.  Psychiatric:        Mood and Affect: Mood normal.        Behavior: Behavior normal.      No results found for any visits on 06/03/24.    The ASCVD Risk score (Arnett DK, et al., 2019) failed to calculate for the following reasons:   The 2019 ASCVD risk score is only valid for ages 19 to 31     Assessment & Plan:   Halina was seen today for medical management of chronic issues.  Diagnoses and all orders for this visit:  Iron deficiency anemia, unspecified iron deficiency anemia type -     CBC with Differential/Platelet -     Iron, TIBC and Ferritin Panel  Hyperthyroidism  Generalized anxiety disorder -     FLUoxetine  (PROZAC ) 20 MG capsule; Take 1 capsule (20 mg total) by mouth daily.  Class 1 obesity due to excess calories without serious comorbidity with body mass index (BMI) of 31.0 to 31.9 in adult   Assessment and Plan    Iron deficiency anemia Iron deficiency anemia managed with iron supplements post-transfusion. - Order CBC and iron panel to assess hemoglobin and iron levels. - Continue iron supplementation twice daily. - Advise taking iron on an empty stomach with orange juice for better absorption. Avoid dairy and caffeine around iron intake.  Hyperthyroidism Hyperthyroidism managed by Endocrinology - Complete labs as ordered by endocrinology  Anxiety disorder Anxiety exacerbated by recent events. Discussed SSRI as first-line treatment. Prozac  chosen due to family history of positive response and potential benefit for OCD symptoms. - Prescribe Prozac  20 mg once daily, follow-up in six weeks to assess response and adjust dosage. - Discuss potential side effects of Prozac , including nausea, headache, fatigue, and rare risk of suicidal thoughts. Advised to report significant side effects.   Obesity - Wegovy  discontinued due to side effects - Stable weight - continue healthy diet and exercsie          Return in about 6 weeks (around 07/15/2024) for medication follow up.   The patient indicates understanding of these issues and agrees with the plan.  Margaret Juarez Search, FNP

## 2024-06-04 ENCOUNTER — Ambulatory Visit: Payer: Self-pay | Admitting: Family Medicine

## 2024-06-04 ENCOUNTER — Other Ambulatory Visit: Payer: Self-pay | Admitting: Internal Medicine

## 2024-06-04 ENCOUNTER — Ambulatory Visit: Payer: Self-pay | Admitting: Internal Medicine

## 2024-06-04 LAB — CBC WITH DIFFERENTIAL/PLATELET
Basophils Absolute: 0 x10E3/uL (ref 0.0–0.2)
Basos: 1 %
EOS (ABSOLUTE): 0.1 x10E3/uL (ref 0.0–0.4)
Eos: 2 %
Hematocrit: 34.1 % (ref 34.0–46.6)
Hemoglobin: 9.2 g/dL — ABNORMAL LOW (ref 11.1–15.9)
Immature Grans (Abs): 0 x10E3/uL (ref 0.0–0.1)
Immature Granulocytes: 0 %
Lymphocytes Absolute: 1.6 x10E3/uL (ref 0.7–3.1)
Lymphs: 23 %
MCH: 17.7 pg — ABNORMAL LOW (ref 26.6–33.0)
MCHC: 27 g/dL — ABNORMAL LOW (ref 31.5–35.7)
MCV: 66 fL — ABNORMAL LOW (ref 79–97)
Monocytes Absolute: 0.5 x10E3/uL (ref 0.1–0.9)
Monocytes: 8 %
Neutrophils Absolute: 4.6 x10E3/uL (ref 1.4–7.0)
Neutrophils: 66 %
Platelets: 336 x10E3/uL (ref 150–450)
RBC: 5.2 x10E6/uL (ref 3.77–5.28)
RDW: 19.2 % — ABNORMAL HIGH (ref 11.7–15.4)
WBC: 6.9 x10E3/uL (ref 3.4–10.8)

## 2024-06-04 LAB — TSH: TSH: <0.005 u[IU]/mL — ABNORMAL LOW (ref 0.450–4.500)

## 2024-06-04 LAB — IRON,TIBC AND FERRITIN PANEL
Ferritin: 9 ng/mL — AB (ref 15–150)
Iron Saturation: 3 — AB (ref 15–55)
Iron: 17 ug/dL — AB (ref 27–159)
Total Iron Binding Capacity: 488 ug/dL — AB (ref 250–450)
UIBC: 471 ug/dL — AB (ref 131–425)

## 2024-06-04 LAB — T4, FREE: Free T4: 1.28 ng/dL (ref 0.82–1.77)

## 2024-06-04 MED ORDER — METHIMAZOLE 5 MG PO TABS: 10.0000 mg | ORAL_TABLET | Freq: Every day | ORAL | 6 refills | Status: DC

## 2024-06-09 ENCOUNTER — Other Ambulatory Visit: Payer: Self-pay | Admitting: Family Medicine

## 2024-06-09 DIAGNOSIS — F411 Generalized anxiety disorder: Secondary | ICD-10-CM

## 2024-06-09 MED ORDER — ESCITALOPRAM OXALATE 10 MG PO TABS: 10.0000 mg | ORAL_TABLET | Freq: Every day | ORAL | 0 refills | Status: DC

## 2024-07-06 DIAGNOSIS — H68012 Acute Eustachian salpingitis, left ear: Secondary | ICD-10-CM | POA: Diagnosis not present

## 2024-07-15 ENCOUNTER — Encounter: Payer: Self-pay | Admitting: Family Medicine

## 2024-07-15 ENCOUNTER — Other Ambulatory Visit: Payer: Self-pay | Admitting: Medical Genetics

## 2024-07-15 ENCOUNTER — Ambulatory Visit: Admitting: Family Medicine

## 2024-07-15 VITALS — BP 118/84 | HR 85 | Temp 98.3°F | Ht 69.0 in | Wt 223.0 lb

## 2024-07-15 DIAGNOSIS — F320 Major depressive disorder, single episode, mild: Secondary | ICD-10-CM | POA: Diagnosis not present

## 2024-07-15 DIAGNOSIS — R252 Cramp and spasm: Secondary | ICD-10-CM | POA: Diagnosis not present

## 2024-07-15 DIAGNOSIS — D509 Iron deficiency anemia, unspecified: Secondary | ICD-10-CM

## 2024-07-15 DIAGNOSIS — F411 Generalized anxiety disorder: Secondary | ICD-10-CM | POA: Diagnosis not present

## 2024-07-15 DIAGNOSIS — M5414 Radiculopathy, thoracic region: Secondary | ICD-10-CM

## 2024-07-15 DIAGNOSIS — E0590 Thyrotoxicosis, unspecified without thyrotoxic crisis or storm: Secondary | ICD-10-CM | POA: Diagnosis not present

## 2024-07-15 MED ORDER — BUSPIRONE HCL 5 MG PO TABS: 5.0000 mg | ORAL_TABLET | Freq: Two times a day (BID) | ORAL | 1 refills | Status: AC

## 2024-07-15 MED ORDER — BUPROPION HCL ER (XL) 150 MG PO TB24: 150.0000 mg | ORAL_TABLET | Freq: Every day | ORAL | 3 refills | Status: AC

## 2024-07-15 NOTE — Progress Notes (Signed)
 Established Patient Office Visit  Subjective   Patient ID: Margaret Juarez, female    DOB: 09-02-1996  Age: 28 y.o. MRN: [id number]  Chief Complaint  Patient presents with   Medical Management of Chronic Issues    HPI  History of Present Illness   ADRIAN SPECHT is a 28 year old female with hyperthyroidism who presents with worsening depression and anxiety symptoms.  Depressive and anxiety symptoms - Worsening depression and anxiety symptoms - Persistent sadness and fatigue - Difficulty getting out of bed and finding daily tasks challenging - Initially started on Prozac , later switched to Lexapro , which caused palpitations and anxiety about taking the medication, leading to discontinuation - Previously tolerated Buspar  and hydroxyzine without issues  Thyroid  dysfunction and medication effects - Diagnosed with hyperthyroidism - Currently taking methimazole  twice daily for elevated thyroid  levels - Increased methimazole  dosage associated with weight gain, lack of energy, and fatigue  Cardiac and chest symptoms - Currently taking atenolol  for heart rate control - Palpitations occurred with Lexapro  use - Sharp, unilateral chest pain for several days, worsens with exhalation while lying down, improves when sitting up - No recent heavy lifting or unusual physical activity - Expressed concern about possible interaction between atenolol  and ibuprofen   Musculoskeletal symptoms - Muscle cramps in legs for the past couple of weeks - Attempting to stay hydrated and maintain electrolyte balance  Iron deficiency - Previously low iron levels, currently taking iron supplement - Improvement in iron levels with supplementation          07/15/2024   11:02 AM 06/03/2024   10:56 AM 04/09/2024   11:11 AM  Depression screen PHQ 2/9  Decreased Interest 0 0 0  Down, Depressed, Hopeless 1 0 0  PHQ - 2 Score 1 0 0  Altered sleeping 1 0 3  Tired, decreased energy 2 1 3   Change in  appetite 0 0 0  Feeling bad or failure about yourself  0 0 0  Trouble concentrating 1 1 0  Moving slowly or fidgety/restless 0 0 0  Suicidal thoughts 0 0 0  PHQ-9 Score 5 2 6   Difficult doing work/chores Somewhat difficult Somewhat difficult Somewhat difficult      07/15/2024   11:02 AM 06/03/2024   10:57 AM 04/09/2024   11:12 AM 01/05/2024   10:09 AM  GAD 7 : Generalized Anxiety Score  Nervous, Anxious, on Edge 1 3 1 1   Control/stop worrying 1 3 1 1   Worry too much - different things 1 3 1 1   Trouble relaxing 1 2 1 1   Restless 0 1 1 0  Easily annoyed or irritable 1 1 0 1  Afraid - awful might happen 0 1 0 1  Total GAD 7 Score 5 14 5 6   Anxiety Difficulty Somewhat difficult Somewhat difficult Somewhat difficult Not difficult at all       ROS As per HPI.   Objective:     BP 118/84   Pulse 85   Temp 98.3 F (36.8 C) (Temporal)   Ht 5' 9 (1.753 m)   Wt 223 lb (101.2 kg)   SpO2 99%   BMI 32.93 kg/m    Physical Exam Vitals and nursing note reviewed.  Constitutional:      General: She is not in acute distress.    Appearance: Normal appearance. She is not ill-appearing, toxic-appearing or diaphoretic.  Cardiovascular:     Rate and Rhythm: Normal rate and regular rhythm.     Pulses: Normal pulses.  Heart sounds: Normal heart sounds. No murmur heard. Pulmonary:     Effort: Pulmonary effort is normal. No respiratory distress.     Breath sounds: Normal breath sounds.  Abdominal:     General: Bowel sounds are normal. There is no distension.     Palpations: Abdomen is soft. There is no mass.     Tenderness: There is no abdominal tenderness. There is no guarding or rebound.  Musculoskeletal:     Thoracic back: Tenderness (left sided) present. No swelling, edema, deformity, signs of trauma, spasms or bony tenderness. Normal range of motion.     Right lower leg: No edema.     Left lower leg: No edema.  Skin:    General: Skin is warm and dry.  Neurological:      General: No focal deficit present.     Mental Status: She is alert and oriented to person, place, and time.  Psychiatric:        Mood and Affect: Mood normal.        Behavior: Behavior normal.      No results found for any visits on 07/15/24.    The ASCVD Risk score (Arnett DK, et al., 2019) failed to calculate for the following reasons:   The 2019 ASCVD risk score is only valid for ages 12 to 58    Assessment & Plan:   Abiola was seen today for medical management of chronic issues.  Diagnoses and all orders for this visit:  Generalized anxiety disorder -     busPIRone  (BUSPAR ) 5 MG tablet; Take 1 tablet (5 mg total) by mouth 2 (two) times daily.  Depression, major, single episode, mild -     buPROPion (WELLBUTRIN XL) 150 MG 24 hr tablet; Take 1 tablet (150 mg total) by mouth daily.  Hyperthyroidism  Muscle cramps -     Anemia Profile B -     Magnesium -     CMP14+EGFR  Iron deficiency anemia, unspecified iron deficiency anemia type -     Anemia Profile B  Radicular pain of thoracic region      Depression and generalized anxiety disorder Worsening depression with increased sadness, fatigue, and feeling overwhelmed. Previous SSRI use exacerbated symptoms. Prefers to avoid SSRIs. - Prescribe Wellbutrin 150 mg once daily. - Prescribe Buspar  twice daily, with option to increase to three times daily if needed. - Educated on potential side effects, including rare risk of increased suicidal thoughts. - Reassured Wellbutrin and Buspar  should not affect heart rate. - Advised to monitor symptoms and report concerns, especially suicidal thoughts.  Thoracic radicular pain Sharp nerve pain in thoracic region, likely due to inflammation. Improvement expected over the next few weeks.  - Advise use of heat and ibuprofen  to reduce inflammation and pain. - Educated on ibuprofen 's anti-inflammatory properties compared to Tylenol. - Reassured ibuprofen  can be taken with atenolol   without significant concern.  Hyperthyroidism Managed with methimazole , recently increased dosage. Increased fatigue and difficulty with daily tasks. - Advise to contact endocrinology for further evaluation and management.  Iron deficiency anemia Taking iron supplement. Recheck planned as it has been six weeks since last test. - Order lab work to recheck iron levels.  Muscle cramps, legs Experiencing leg cramps for a couple of weeks. Plan to check magnesium levels for potential electrolyte imbalances. - Order lab work to check magnesium levels.       Return in about 6 weeks (around 08/26/2024) for medication follow up.   The patient indicates understanding of these  issues and agrees with the plan.  Annabella CHRISTELLA Search, FNP

## 2024-07-16 LAB — CMP14+EGFR
ALT: 25 IU/L (ref 0–32)
AST: 22 IU/L (ref 0–40)
Albumin: 4.3 g/dL (ref 4.0–5.0)
Alkaline Phosphatase: 78 IU/L (ref 41–116)
BUN/Creatinine Ratio: 23 (ref 9–23)
BUN: 16 mg/dL (ref 6–20)
Bilirubin Total: 0.3 mg/dL (ref 0.0–1.2)
CO2: 20 mmol/L (ref 20–29)
Calcium: 9.3 mg/dL (ref 8.7–10.2)
Chloride: 103 mmol/L (ref 96–106)
Creatinine, Ser: 0.69 mg/dL (ref 0.57–1.00)
Globulin, Total: 3.3 g/dL (ref 1.5–4.5)
Glucose: 78 mg/dL (ref 70–99)
Potassium: 4.2 mmol/L (ref 3.5–5.2)
Sodium: 138 mmol/L (ref 134–144)
Total Protein: 7.6 g/dL (ref 6.0–8.5)
eGFR: 122 mL/min/1.73 (ref >59)

## 2024-07-16 LAB — ANEMIA PROFILE B
Basophils Absolute: 0.1 x10E3/uL (ref 0.0–0.2)
Basos: 1 %
EOS (ABSOLUTE): 0.1 x10E3/uL (ref 0.0–0.4)
Eos: 1 %
Ferritin: 7 ng/mL — ABNORMAL LOW (ref 15–150)
Folate: 8 ng/mL (ref >3.0)
Hematocrit: 35.9 % (ref 34.0–46.6)
Hemoglobin: 10.2 g/dL — ABNORMAL LOW (ref 11.1–15.9)
Immature Grans (Abs): 0 x10E3/uL (ref 0.0–0.1)
Immature Granulocytes: 0 %
Iron Saturation: 4 % — CL (ref 15–55)
Iron: 17 ug/dL — ABNORMAL LOW (ref 27–159)
Lymphocytes Absolute: 2.1 x10E3/uL (ref 0.7–3.1)
Lymphs: 24 %
MCH: 19.1 pg — ABNORMAL LOW (ref 26.6–33.0)
MCHC: 28.4 g/dL — ABNORMAL LOW (ref 31.5–35.7)
MCV: 67 fL — ABNORMAL LOW (ref 79–97)
Monocytes Absolute: 0.7 x10E3/uL (ref 0.1–0.9)
Monocytes: 8 %
Neutrophils Absolute: 5.4 x10E3/uL (ref 1.4–7.0)
Neutrophils: 66 %
Platelets: 333 x10E3/uL (ref 150–450)
RBC: 5.33 x10E6/uL — ABNORMAL HIGH (ref 3.77–5.28)
RDW: 17 % — ABNORMAL HIGH (ref 11.7–15.4)
Retic Ct Pct: 1.2 % (ref 0.6–2.6)
Total Iron Binding Capacity: 465 ug/dL — ABNORMAL HIGH (ref 250–450)
UIBC: 448 ug/dL — ABNORMAL HIGH (ref 131–425)
Vitamin B-12: 699 pg/mL (ref 232–1245)
WBC: 8.5 x10E3/uL (ref 3.4–10.8)

## 2024-07-16 LAB — MAGNESIUM: Magnesium: 1.8 mg/dL (ref 1.6–2.3)

## 2024-07-19 ENCOUNTER — Ambulatory Visit: Payer: Self-pay | Admitting: Family Medicine

## 2024-07-24 NOTE — Progress Notes (Signed)
 Insurance Risk Surveyor Encounter This medical encounter was conducted virtually using [email or web address]  TeleHealth protocols  Patient ID: Margaret Juarez is a 28 y.o. female who presents by e-visit interaction for evaluation.  Assessment/Plan:    Margaret Juarez was seen today for rash.  Diagnoses and all orders for this visit:  Rash -     clindamycin (CLEOCIN T) 1 % lotion; Apply topically two (2) times a day.   Counseled patient on OTC & prescription medications for symptom management.   Follow-up as Needed  and Follow-up with PCP     Subjective:   HPI Margaret Juarez is 28 y.o. and presents today in the [facility].  The PCP for this patient is Department, [facility].  I have reviewed the E-visit questionnaire submitted by the patient.  Refer to patient E-visit questionnaire and follow-up mychart message and questions for HPI.  I have reviewed the problem list, past medical history, past family history, medications, and allergies.      Objective:   Visit conducted via e-Visit workflow  I spent 10 minutes completing this visit.   As part of this e-Visit, no in-person exam was conducted. This visit was conducted by a [employer].  The patient has attested they are in [place]  during the time of the e-visit.

## 2024-08-11 ENCOUNTER — Emergency Department (HOSPITAL_COMMUNITY)

## 2024-08-11 ENCOUNTER — Other Ambulatory Visit: Payer: Self-pay

## 2024-08-11 ENCOUNTER — Encounter (HOSPITAL_COMMUNITY): Payer: Self-pay

## 2024-08-11 ENCOUNTER — Emergency Department (HOSPITAL_COMMUNITY)
Admission: EM | Admit: 2024-08-11 | Discharge: 2024-08-11 | Disposition: A | Discharge status: Home / Self Care | Attending: Emergency Medicine | Admitting: Emergency Medicine

## 2024-08-11 DIAGNOSIS — R1011 Right upper quadrant pain: Secondary | ICD-10-CM | POA: Diagnosis not present

## 2024-08-11 DIAGNOSIS — O3680X0 Pregnancy with inconclusive fetal viability, not applicable or unspecified: Secondary | ICD-10-CM | POA: Diagnosis not present

## 2024-08-11 DIAGNOSIS — J45909 Unspecified asthma, uncomplicated: Secondary | ICD-10-CM | POA: Diagnosis not present

## 2024-08-11 DIAGNOSIS — K760 Fatty (change of) liver, not elsewhere classified: Secondary | ICD-10-CM | POA: Diagnosis not present

## 2024-08-11 DIAGNOSIS — Z3A00 Weeks of gestation of pregnancy not specified: Secondary | ICD-10-CM | POA: Diagnosis not present

## 2024-08-11 DIAGNOSIS — R109 Unspecified abdominal pain: Secondary | ICD-10-CM

## 2024-08-11 DIAGNOSIS — O26899 Other specified pregnancy related conditions, unspecified trimester: Secondary | ICD-10-CM | POA: Insufficient documentation

## 2024-08-11 DIAGNOSIS — R1031 Right lower quadrant pain: Secondary | ICD-10-CM | POA: Insufficient documentation

## 2024-08-11 DIAGNOSIS — Z3A01 Less than 8 weeks gestation of pregnancy: Secondary | ICD-10-CM | POA: Diagnosis not present

## 2024-08-11 DIAGNOSIS — O26891 Other specified pregnancy related conditions, first trimester: Secondary | ICD-10-CM | POA: Diagnosis not present

## 2024-08-11 LAB — HCG, SERUM, QUALITATIVE: Preg, Serum: POSITIVE — AB

## 2024-08-11 LAB — COMPREHENSIVE METABOLIC PANEL WITH GFR
ALT: 21 U/L (ref 0–44)
AST: 23 U/L (ref 15–41)
Albumin: 4.8 g/dL (ref 3.5–5.0)
Alkaline Phosphatase: 97 U/L (ref 38–126)
Anion gap: 14 (ref 5–15)
BUN: 13 mg/dL (ref 6–20)
CO2: 22 mmol/L (ref 22–32)
Calcium: 9.2 mg/dL (ref 8.9–10.3)
Chloride: 101 mmol/L (ref 98–111)
Creatinine, Ser: 0.69 mg/dL (ref 0.44–1.00)
GFR, Estimated: >60 mL/min (ref >60)
Glucose, Bld: 104 mg/dL — ABNORMAL HIGH (ref 70–99)
Potassium: 3.6 mmol/L (ref 3.5–5.1)
Sodium: 137 mmol/L (ref 135–145)
Total Bilirubin: 0.5 mg/dL (ref 0.0–1.2)
Total Protein: 8.8 g/dL — ABNORMAL HIGH (ref 6.5–8.1)

## 2024-08-11 LAB — URINALYSIS, ROUTINE W REFLEX MICROSCOPIC
Bacteria, UA: NONE SEEN
Bilirubin Urine: NEGATIVE
Glucose, UA: NEGATIVE mg/dL
Ketones, ur: NEGATIVE mg/dL
Leukocytes,Ua: NEGATIVE
Nitrite: NEGATIVE
Protein, ur: NEGATIVE mg/dL
Specific Gravity, Urine: 1.026 (ref 1.005–1.030)
pH: 5 (ref 5.0–8.0)

## 2024-08-11 LAB — CBC WITH DIFFERENTIAL/PLATELET
Abs Immature Granulocytes: 0.02 K/uL (ref 0.00–0.07)
Basophils Absolute: 0.1 K/uL (ref 0.0–0.1)
Basophils Relative: 1 %
Eosinophils Absolute: 0.1 K/uL (ref 0.0–0.5)
Eosinophils Relative: 1 %
HCT: 39.1 % (ref 36.0–46.0)
Hemoglobin: 11.5 g/dL — ABNORMAL LOW (ref 12.0–15.0)
Immature Granulocytes: 0 %
Lymphocytes Relative: 8 %
Lymphs Abs: 0.8 K/uL (ref 0.7–4.0)
MCH: 19.8 pg — ABNORMAL LOW (ref 26.0–34.0)
MCHC: 29.4 g/dL — ABNORMAL LOW (ref 30.0–36.0)
MCV: 67.3 fL — ABNORMAL LOW (ref 80.0–100.0)
Monocytes Absolute: 0.6 K/uL (ref 0.1–1.0)
Monocytes Relative: 5 %
Neutro Abs: 9.5 K/uL — ABNORMAL HIGH (ref 1.7–7.7)
Neutrophils Relative %: 85 %
Platelets: 301 K/uL (ref 150–400)
RBC: 5.81 MIL/uL — ABNORMAL HIGH (ref 3.87–5.11)
RDW: 19.5 % — ABNORMAL HIGH (ref 11.5–15.5)
WBC: 11.1 K/uL — ABNORMAL HIGH (ref 4.0–10.5)
nRBC: 0 % (ref 0.0–0.2)

## 2024-08-11 LAB — LIPASE, BLOOD: Lipase: 32 U/L (ref 11–51)

## 2024-08-11 LAB — HCG, QUANTITATIVE, PREGNANCY: hCG, Beta Chain, Quant, S: 108 m[IU]/mL — ABNORMAL HIGH (ref <5)

## 2024-08-11 MED ORDER — SODIUM CHLORIDE 0.9 % IV BOLUS
1000.0000 mL | Freq: Once | INTRAVENOUS | Status: AC
  Administered 2024-08-11: 1000 mL via INTRAVENOUS

## 2024-08-11 MED ORDER — ACETAMINOPHEN 325 MG PO TABS: 650.0000 mg | ORAL_TABLET | Freq: Four times a day (QID) | ORAL | 0 refills | Status: AC | PRN

## 2024-08-11 MED ORDER — ONDANSETRON HCL 4 MG/2ML IJ SOLN
4.0000 mg | Freq: Once | INTRAMUSCULAR | Status: AC
  Administered 2024-08-11: 4 mg via INTRAVENOUS
  Filled 2024-08-11: qty 2

## 2024-08-11 MED ORDER — HYDROMORPHONE HCL 1 MG/ML IJ SOLN
1.0000 mg | Freq: Once | INTRAMUSCULAR | Status: AC
  Administered 2024-08-11: 1 mg via INTRAVENOUS
  Filled 2024-08-11: qty 1

## 2024-08-11 MED ORDER — ACETAMINOPHEN 500 MG PO TABS
1000.0000 mg | ORAL_TABLET | Freq: Once | ORAL | Status: AC
  Administered 2024-08-11: 1000 mg via ORAL
  Filled 2024-08-11: qty 2

## 2024-08-11 NOTE — ED Provider Notes (Signed)
 [place] EMERGENCY DEPARTMENT AT [facility] Provider Note  CSN: [id number] Arrival date & time: 08/11/24 9175  Chief Complaint(s) Abdominal Pain  HPI Margaret Juarez is a 28 y.o. female with past medical history as below, significant for asthma, grave's disease, VWB disease, pansinusitis who presents to the ED with complaint of abd pain/nausea  Pt reports acute onset RLQ/RUQ abd pain this am w/ nausea no emesis. No fever or chills. No change to bowel/bladder function. Denies similar symptoms previously, normal state of health prior to onset. Pain sharp/stabbing, fairly diffuse on right side. No travel, no fever, no sick contacts. No pelvic pain reported.  LMP was around a month ago   Past Medical History Past Medical History:  Diagnosis Date   Asthma    Chronic pansinusitis    Graves disease 2018   Von Willebrand disease (HCC)    Patient Active Problem List   Diagnosis Date Noted   Iron deficiency anemia 01/05/2024   Vitamin D  deficiency 01/05/2024   Graves disease 12/10/2023   Class 1 obesity due to excess calories without serious comorbidity with body mass index (BMI) of 30.0 to 30.9 in adult 10/31/2022   Cyst of right ovary 06/10/2022   Anxiety 06/10/2022   Encounter for contraceptive management 06/10/2022   [redacted] weeks gestation of pregnancy 02/27/2021   Nonadherence to medical treatment 02/27/2021   Hyperthyroidism 03/19/2018   Graves' disease 03/19/2018   [redacted] weeks gestation of pregnancy 03/19/2018   Home Medication(s) Prior to Admission medications   Medication Sig Start Date End Date Taking? Authorizing Provider  atenolol  (TENORMIN ) 25 MG tablet Take 1 tablet (25 mg total) by mouth at bedtime. 12/10/23   Shamleffer, Ibtehal Jaralla, MD  buPROPion (WELLBUTRIN XL) 150 MG 24 hr tablet Take 1 tablet (150 mg total) by mouth daily. 07/15/24   Joesph Annabella HERO, FNP  busPIRone  (BUSPAR ) 5 MG tablet Take 1 tablet (5 mg total) by mouth 2 (two) times daily. 07/15/24    Joesph Annabella HERO, FNP  ferrous sulfate 325 (65 FE) MG tablet Take 325 mg by mouth 2 (two) times daily with a meal.    [provider]  LO LOESTRIN FE 1 MG-10 MCG / 10 MCG tablet Take 1 tablet by mouth daily. 04/20/24   [provider]  methimazole  (TAPAZOLE ) 5 MG tablet Take 2 tablets (10 mg total) by mouth daily. 06/04/24   Shamleffer, Donell Cardinal, MD                                                                                                                                    Past Surgical History Past Surgical History:  Procedure Laterality Date   WISDOM TOOTH EXTRACTION     Family History Family History  Problem Relation Age of Onset   Osteoarthritis Mother    Diabetes Father    Heart disease Father    Osteoarthritis Father    Osteoarthritis Paternal  Aunt    Breast cancer Paternal Aunt     Social History Social History   Tobacco Use   Smoking status: Never   Smokeless tobacco: Never  Vaping Use   Vaping status: Never Used  Substance Use Topics   Alcohol use: No   Drug use: Not Currently    Types: Marijuana   Allergies Mangifera indica, Mango butter, and Cephalexin  Review of Systems A thorough review of systems was obtained and all systems are negative except as noted in the HPI and PMH.   Physical Exam Vital Signs  I have reviewed the triage vital signs BP (!) 135/92 (BP Location: Left Arm)   Pulse (!) 104   Temp 97.7 F (36.5 C) (Oral)   Resp 18   Ht 5' 9 (1.753 m)   Wt 99.8 kg   SpO2 97%   BMI 32.49 kg/m  Physical Exam Vitals and nursing note reviewed.  Constitutional:      General: She is not in acute distress.    Appearance: Normal appearance. She is well-developed. She is obese. She is not ill-appearing.  HENT:     Head: Normocephalic and atraumatic.     Right Ear: External ear normal.     Left Ear: External ear normal.     Nose: Nose normal.     Mouth/Throat:     Mouth: Mucous membranes are moist.  Eyes:     General:  No scleral icterus.       Right eye: No discharge.        Left eye: No discharge.  Cardiovascular:     Rate and Rhythm: Normal rate.  Pulmonary:     Effort: Pulmonary effort is normal. No respiratory distress.     Breath sounds: No stridor.  Abdominal:     General: Abdomen is flat. There is no distension.     Palpations: Abdomen is soft.     Tenderness: There is abdominal tenderness in the right upper quadrant and right lower quadrant. There is no guarding.  Musculoskeletal:        General: No deformity.     Cervical back: No rigidity.  Skin:    General: Skin is warm and dry.     Coloration: Skin is not cyanotic, jaundiced or pale.  Neurological:     Mental Status: She is alert.  Psychiatric:        Speech: Speech normal.        Behavior: Behavior normal. Behavior is cooperative.     ED Results and Treatments Labs (all labs ordered are listed, but only abnormal results are displayed) Labs Reviewed - No data to display                                                                                                                        Radiology No results found.  Pertinent labs & imaging results that were available during my care of the patient were reviewed by me and considered in my medical  decision making (see MDM for details).  Medications Ordered in ED Medications - No data to display                                                                                                                                   Procedures Procedures  (including critical care time)  Medical Decision Making / ED Course    Medical Decision Making:    Margaret Juarez is a 28 y.o. female with past medical history as below, significant for asthma, grave's disease, VWB disease, pansinusitis who presents to the ED with complaint of abd pain/nausea. The complaint involves an extensive differential diagnosis and also carries with it a high risk of complications and morbidity.  Serious  etiology was considered. Ddx includes but is not limited to: Differential diagnosis includes but is not exclusive to ectopic pregnancy, ovarian cyst, ovarian torsion, acute appendicitis, urinary tract infection, endometriosis, bowel obstruction, hernia, colitis, renal colic, gastroenteritis, volvulus, gastritis, pancreatitis, biliary colic etc.   Complete initial physical exam performed, notably the patient was in nad, appears unconfortable.    Reviewed and confirmed nursing documentation for past medical history, family history, social history.  Vital signs reviewed.    RUQ/RLQ abd pain Nausea First trimester pregnancy> - onset this AM - RUQ/RLQ/right flank TTP, soft, non peritoneal - Serum pregnancy is positive, follow-up beta quant is 108.  LMP was around a month ago - Imaging is stable  Pregnancy of unknown location> - Patient positive pregnancy test, ultrasound does not show IUP but also is very early in pregnancy.  Pregnancy of unknown origin considered versus early pregnancy, etc.  Will have patient follow-up with her OB for repeat beta in 2 days, repeat ultrasound - Patient reports recent abortion earlier this year - Prenatal care instructions - Advised to follow-up with OB/GYN/pcp regarding her home medications and alternatives given some her medications are contraindicated in pregnancy    Patient symptoms have improved, symptoms likely secondary to early pregnancy.  Encourage close follow-up with OB.  Advised to return here or go to [facility] if unable to see OB for repeat beta in 2 days  Abdominal pain precautions advised.  Encouraged bland diet, rehydration  I have discussed the diagnosis/risks/treatment options with the patient.  Evaluation and diagnostic testing in the emergency department does not suggest an emergent condition requiring admission or immediate intervention beyond what has been performed at this time.  They will follow up with obgyn/pcp. We also discussed returning to  the ED immediately if new or worsening sx occur. We discussed the sx which are most concerning (e.g., sudden worsening pain, fever, inability to tolerate by mouth , pelvic pain, vaginal bleeding) that necessitate immediate return.    The patient appears reasonably screened and/or stabilized for discharge and I doubt any other medical condition or other [facility] requiring further screening, evaluation, or treatment in the ED at this time prior to discharge.  Additional history obtained: -Additional history obtained from na -External records from outside source obtained and reviewed including: Chart review including previous notes, labs, imaging, consultation notes including  Prior labs   Lab Tests: -I ordered, reviewed, and interpreted labs.   The pertinent results include:   Labs Reviewed - No data to display  Notable for as above  EKG   EKG Interpretation Date/Time:    Ventricular Rate:    PR Interval:    QRS Duration:    QT Interval:    QTC Calculation:   R Axis:      Text Interpretation:           Imaging Studies ordered: I ordered imaging studies including ultrasound abd I independently visualized the following imaging with scope of interpretation limited to determining acute life threatening conditions related to emergency care; findings noted above I agree with the radiologist interpretation If any imaging was obtained with contrast I closely monitored patient for any possible adverse reaction a/w contrast administration in the emergency department   Medicines ordered and prescription drug management: No orders of the defined types were placed in this encounter.   -I have reviewed the patients home medicines and have made adjustments as needed   Consultations Obtained: na   Cardiac Monitoring: Continuous pulse oximetry interpreted by myself, 98% on rA.    Social Determinants of Health:  Diagnosis or treatment significantly  limited by social determinants of health: obesity, pregnant   Reevaluation: After the interventions noted above, I reevaluated the patient and found that they have improved  Co morbidities that complicate the patient evaluation  Past Medical History:  Diagnosis Date   Asthma    Chronic pansinusitis    Graves disease 2018   Von Willebrand disease (HCC)       Dispostion: Disposition decision including need for hospitalization was considered, and patient discharged from emergency department.    Final Clinical Impression(s) / ED Diagnoses Final diagnoses:  None        Elnor Jayson LABOR, DO 08/12/24 9275

## 2024-08-11 NOTE — ED Triage Notes (Signed)
 Pt to er room number 12, pt states that she is here for some abd pain that starting during the night, states that it is a cramping pain, pt reports some nausea, but denies vomiting, diarrhea or vaginal bleeding.

## 2024-08-11 NOTE — Discharge Instructions (Addendum)
 It was a pleasure caring for you today in the emergency department.  It appears as though you are recently pregnant.  Ultrasound was not revealing for location of the pregnancy but it is also likely very early in the pregnancy.  It is very importantly follow-up with your OB/GYN or come back here for repeat beta-hCG in 48 hours and evaluation of possible repeat ultrasound.  You can also go to the maternity assessment unit at [facility] and they can order this testing as well.  Please return to the emergency department for any worsening or worrisome symptoms including but not limited to pelvic pain, vaginal bleeding, vomiting, etc.

## 2024-08-12 ENCOUNTER — Encounter (HOSPITAL_COMMUNITY): Payer: Self-pay | Admitting: Emergency Medicine

## 2024-08-13 ENCOUNTER — Encounter (HOSPITAL_COMMUNITY): Payer: Self-pay

## 2024-08-13 ENCOUNTER — Other Ambulatory Visit: Payer: Self-pay

## 2024-08-13 ENCOUNTER — Emergency Department (HOSPITAL_COMMUNITY)
Admission: EM | Admit: 2024-08-13 | Discharge: 2024-08-13 | Disposition: A | Discharge status: Home / Self Care | Source: Ambulatory Visit | Attending: Emergency Medicine | Admitting: Emergency Medicine

## 2024-08-13 DIAGNOSIS — O3680X0 Pregnancy with inconclusive fetal viability, not applicable or unspecified: Secondary | ICD-10-CM

## 2024-08-13 DIAGNOSIS — Z3A00 Weeks of gestation of pregnancy not specified: Secondary | ICD-10-CM | POA: Insufficient documentation

## 2024-08-13 DIAGNOSIS — O26891 Other specified pregnancy related conditions, first trimester: Secondary | ICD-10-CM | POA: Diagnosis not present

## 2024-08-13 DIAGNOSIS — J45909 Unspecified asthma, uncomplicated: Secondary | ICD-10-CM | POA: Diagnosis not present

## 2024-08-13 DIAGNOSIS — R1030 Lower abdominal pain, unspecified: Secondary | ICD-10-CM | POA: Diagnosis not present

## 2024-08-13 DIAGNOSIS — O26899 Other specified pregnancy related conditions, unspecified trimester: Secondary | ICD-10-CM | POA: Diagnosis not present

## 2024-08-13 DIAGNOSIS — O0281 Inappropriate change in quantitative human chorionic gonadotropin (hCG) in early pregnancy: Secondary | ICD-10-CM | POA: Insufficient documentation

## 2024-08-13 DIAGNOSIS — Z3A01 Less than 8 weeks gestation of pregnancy: Secondary | ICD-10-CM | POA: Diagnosis not present

## 2024-08-13 DIAGNOSIS — R1031 Right lower quadrant pain: Secondary | ICD-10-CM | POA: Insufficient documentation

## 2024-08-13 LAB — CBC WITH DIFFERENTIAL/PLATELET
Abs Immature Granulocytes: 0.01 K/uL (ref 0.00–0.07)
Basophils Absolute: 0 K/uL (ref 0.0–0.1)
Basophils Relative: 1 %
Eosinophils Absolute: 0.2 K/uL (ref 0.0–0.5)
Eosinophils Relative: 3 %
HCT: 35.4 % — ABNORMAL LOW (ref 36.0–46.0)
Hemoglobin: 10.1 g/dL — ABNORMAL LOW (ref 12.0–15.0)
Immature Granulocytes: 0 %
Lymphocytes Relative: 23 %
Lymphs Abs: 1.2 K/uL (ref 0.7–4.0)
MCH: 19.5 pg — ABNORMAL LOW (ref 26.0–34.0)
MCHC: 28.5 g/dL — ABNORMAL LOW (ref 30.0–36.0)
MCV: 68.3 fL — ABNORMAL LOW (ref 80.0–100.0)
Monocytes Absolute: 0.5 K/uL (ref 0.1–1.0)
Monocytes Relative: 9 %
Neutro Abs: 3.4 K/uL (ref 1.7–7.7)
Neutrophils Relative %: 64 %
Platelets: 236 K/uL (ref 150–400)
RBC: 5.18 MIL/uL — ABNORMAL HIGH (ref 3.87–5.11)
RDW: 18.9 % — ABNORMAL HIGH (ref 11.5–15.5)
WBC: 5.3 K/uL (ref 4.0–10.5)
nRBC: 0 % (ref 0.0–0.2)

## 2024-08-13 LAB — HCG, QUANTITATIVE, PREGNANCY: hCG, Beta Chain, Quant, S: 340 m[IU]/mL — ABNORMAL HIGH (ref <5)

## 2024-08-13 NOTE — ED Triage Notes (Signed)
 Pt arrived POV to recheck her HCG levels.

## 2024-08-13 NOTE — Discharge Instructions (Signed)
 Your hCG is going up appropriately but we still cannot tell you that this is necessarily a normal pregnancy and not an ectopic pregnancy.  [facility] for rec[facility]ck and potentially another hCG.  Return for worsening pain lightheadedness or dizziness.

## 2024-08-13 NOTE — ED Provider Notes (Signed)
 [place] EMERGENCY DEPARTMENT AT [facility] Provider Note   CSN: [id number] Arrival date & time: 08/13/24  9164     Patient presents with: Labs Only   Margaret Juarez is a 28 y.o. female.   HPI Patient presents for hCG recheck.  Has had a right mid to lower abdominal pain.  Had been seen 2 days ago.  Had pregnancy of unknown location at that time.  White count mildly elevated.  Had quantitative hCG of 108 at that time.  Comes back for recheck.  Continued right sided abdominal pain.  More mid flank pain.  May be worse after eating.  No vaginal bleeding or discharge.  Last menses around a month ago.   Past Medical History:  Diagnosis Date   Asthma    Chronic pansinusitis    Graves disease 2018   Von Willebrand disease (HCC)     Prior to Admission medications   Medication Sig Start Date End Date Taking? Authorizing Provider  acetaminophen (TYLENOL) 325 MG tablet Take 2 tablets (650 mg total) by mouth every 6 (six) hours as needed. 08/11/24   Elnor Jayson LABOR, DO  buPROPion (WELLBUTRIN XL) 150 MG 24 hr tablet Take 1 tablet (150 mg total) by mouth daily. 07/15/24   Joesph Annabella HERO, FNP  busPIRone  (BUSPAR ) 5 MG tablet Take 1 tablet (5 mg total) by mouth 2 (two) times daily. 07/15/24   Joesph Annabella HERO, FNP  ferrous sulfate 325 (65 FE) MG tablet Take 325 mg by mouth 2 (two) times daily with a meal.    [provider]    Allergies: Mangifera indica, Mango butter, and Cephalexin    Review of Systems  Updated Vital Signs BP 118/75   Pulse 76   Temp 98.7 F (37.1 C) (Oral)   Resp 16   Ht 5' 9 (1.753 m)   Wt 99.8 kg   LMP 07/18/2024 (Exact Date)   SpO2 100%   BMI 32.49 kg/m   Physical Exam Vitals and nursing note reviewed.  HENT:     Head: Atraumatic.  Cardiovascular:     Rate and Rhythm: Normal rate.  Abdominal:     Tenderness: There is abdominal tenderness.     Comments: Right mid flank tenderness.  No rebound or guarding.  No hernia.  Skin:     General: Skin is warm.  Neurological:     Mental Status: She is alert and oriented to person, place, and time.     (all labs ordered are listed, but only abnormal results are displayed) Labs Reviewed  HCG, QUANTITATIVE, PREGNANCY - Abnormal; Notable for the following components:      Result Value   hCG, Beta Chain, Quant, S 340 (*)    All other components within normal limits  CBC WITH DIFFERENTIAL/PLATELET - Abnormal; Notable for the following components:   RBC 5.18 (*)    Hemoglobin 10.1 (*)    HCT 35.4 (*)    MCV 68.3 (*)    MCH 19.5 (*)    MCHC 28.5 (*)    RDW 18.9 (*)    All other components within normal limits    EKG: None  Radiology: No results found.    Procedures   Medications Ordered in the ED - No data to display                                  Medical Decision Making Amount and/or  Complexity of Data Reviewed Labs: ordered.   Patient with right mid abdominal pain.  Positive pregnancy test although quantitative hCG of just around 102 days ago.  Differential diagnosis does include causes such as pregnancy and ectopic pregnancy.  However does have pain that is little high.  Will get repeat hCG and will also get repeat CBC with differential to evaluate for other abnormalities.  Increasing white count could make other causes such as appendicitis more likely  White count is normalized.  Hemoglobin stable to previous.  She states the most recent hemoglobin she had of 11 has never been that high.  hCG is increasing appropriately.  [facility]  Return for worsening symptoms.  Still cannot necessarily verify anatomic location of pregnancy.     Final diagnoses:  Pregnancy of unknown anatomic location    ED Discharge Orders     None          Patsey Lot, MD 08/13/24 1452

## 2024-08-16 ENCOUNTER — Telehealth: Payer: Self-pay | Admitting: *Deleted

## 2024-08-16 DIAGNOSIS — Z3490 Encounter for supervision of normal pregnancy, unspecified, unspecified trimester: Secondary | ICD-10-CM

## 2024-08-16 NOTE — Telephone Encounter (Signed)
 EDFU Please advise

## 2024-08-16 NOTE — Telephone Encounter (Signed)
 Returned patient's call. Informed recent ED visit discussed with Dr Ozan and patient advised to repeat HCG next Monday.   If severe pain develops in either lower quadrant, to let us  know or go to ED to r/o ectopic.  Pt verbalized understanding with no further questions.

## 2024-08-25 ENCOUNTER — Encounter: Payer: Self-pay | Admitting: Family Medicine

## 2024-08-25 ENCOUNTER — Ambulatory Visit: Payer: Self-pay | Admitting: Family Medicine

## 2024-08-25 VITALS — BP 118/75 | HR 81 | Temp 97.9°F | Ht 69.0 in | Wt 223.6 lb

## 2024-08-25 DIAGNOSIS — F411 Generalized anxiety disorder: Secondary | ICD-10-CM | POA: Diagnosis not present

## 2024-08-25 DIAGNOSIS — D509 Iron deficiency anemia, unspecified: Secondary | ICD-10-CM

## 2024-08-25 DIAGNOSIS — F320 Major depressive disorder, single episode, mild: Secondary | ICD-10-CM

## 2024-08-25 DIAGNOSIS — O3680X0 Pregnancy with inconclusive fetal viability, not applicable or unspecified: Secondary | ICD-10-CM

## 2024-08-25 NOTE — Progress Notes (Signed)
 Established Patient Office Visit  Subjective   Patient ID: Margaret Juarez, female    DOB: July 18, 1996  Age: 28 y.o. MRN: [id number]  Chief Complaint  Patient presents with   Medical Management of Chronic Issues    HPI  History of Present Illness   Margaret Juarez is a 28 year old female who presents with abdominal pain and a recent positive HCG test.  She experiences severe abdominal pain that began suddenly around 2 AM while in bed on 08/11/24. The pain is described as excruciating, similar to labor contractions, and is localized near the umbilical region. It worsens with eating and drinking. She has been taking Tylenol every five to six hours for pain management, though the pain persists.  She visited the emergency room where she received medication for pain relief, which provided temporary relief. During this visit, she experienced small amount of vaginal bleeding and had a positive HCG test. An ultrasound was performed but did not reveal any location for pregnancy. Her LMP is 07/18/24 so it was not concerning that US  was unrevealing. Her hemoglobin levels were initially at 11 but dropped to 10.1, and her white blood cell count was elevated on the first day. She was seen again in the ER 2 days later and had a repeat HCG that did double appropriately. She has been in contact with her OB regarding her symptoms and ER visit. They will repeat her HCG level today and plan for a repeat US .   She is currently taking Wellbutrin and Buspar  for anxiety and depression, which have been effective in managing her symptoms, especially during a recent stressful event involving her dog. She notes occasional headaches after taking Buspar , which sometimes require Tylenol.  She is concerned about her iron levels, though her hemoglobin is stable for her. She continues to take an iron supplement. Denies dizziness, syncope.          08/25/2024   10:50 AM 07/15/2024   11:02 AM 06/03/2024   10:56 AM   Depression screen PHQ 2/9  Decreased Interest 0 0 0  Down, Depressed, Hopeless 0 1 0  PHQ - 2 Score 0 1 0  Altered sleeping 1 1 0  Tired, decreased energy 1 2 1   Change in appetite 0 0 0  Feeling bad or failure about yourself  0 0 0  Trouble concentrating 0 1 1  Moving slowly or fidgety/restless 0 0 0  Suicidal thoughts 0 0 0  PHQ-9 Score 2 5  2    Difficult doing work/chores Not difficult at all Somewhat difficult Somewhat difficult     Data saved with a previous flowsheet row definition      08/25/2024   10:51 AM 07/15/2024   11:02 AM 06/03/2024   10:57 AM 04/09/2024   11:12 AM  GAD 7 : Generalized Anxiety Score  Nervous, Anxious, on Edge 0 1 3 1   Control/stop worrying 0 1 3 1   Worry too much - different things 0 1 3 1   Trouble relaxing 0 1 2 1   Restless 0 0 1 1  Easily annoyed or irritable 1 1 1  0  Afraid - awful might happen 1 0 1 0  Total GAD 7 Score 2 5 14 5   Anxiety Difficulty Not difficult at all Somewhat difficult Somewhat difficult Somewhat difficult       ROS As per HPI.   Objective:     BP 118/75   Pulse 81   Temp 97.9 F (36.6 C) (Temporal)  Ht 5' 9 (1.753 m)   Wt 223 lb 9.6 oz (101.4 kg)   LMP 07/18/2024 (Exact Date)   SpO2 97%   BMI 33.02 kg/m    Physical Exam Vitals and nursing note reviewed.  Constitutional:      General: She is not in acute distress.    Appearance: Normal appearance. She is not ill-appearing, toxic-appearing or diaphoretic.  Cardiovascular:     Rate and Rhythm: Normal rate and regular rhythm.     Pulses: Normal pulses.     Heart sounds: Normal heart sounds. No murmur heard. Pulmonary:     Effort: Pulmonary effort is normal. No respiratory distress.     Breath sounds: Normal breath sounds.  Abdominal:     General: There is no distension.  Musculoskeletal:     Right lower leg: No edema.     Left lower leg: No edema.  Lymphadenopathy:     Cervical: No cervical adenopathy.  Skin:    General: Skin is warm and  dry.  Neurological:     General: No focal deficit present.     Mental Status: She is alert and oriented to person, place, and time.  Psychiatric:        Mood and Affect: Mood normal.        Behavior: Behavior normal.        Thought Content: Thought content normal.        Judgment: Judgment normal.      No results found for any visits on 08/25/24.    The ASCVD Risk score (Arnett DK, et al., 2019) failed to calculate for the following reasons:   The 2019 ASCVD risk score is only valid for ages 75 to 68    Assessment & Plan:   Margaret Juarez was seen today for medical management of chronic issues.  Diagnoses and all orders for this visit:  Depression, major, single episode, mild  Generalized anxiety disorder  Pregnancy of unknown anatomic location  Iron deficiency anemia, unspecified iron deficiency anemia type   Assessment and Plan    Pregnancy with inconclusive fetal viability Positive HCG with initial ultrasound showing no fetal viability with abdominal pain and vaginal bleeding - Continue follow up with OB. Notify immediately of any changes or worsening of symptoms.   Iron deficiency anemia Hemoglobin decreased from 11 to 10.1. No significant concern for anemia at this time as levels are stable overall for her.  - Continue iron supplementation.  Depression and generalized anxiety disorder, stable on current therapy Depression and anxiety well-managed on Wellbutrin and Buspar . Reports significant improvement in symptoms. - Continue current medications (Wellbutrin and Buspar ). - Monitor for side effects and adjust dosage if necessary.       Return in about 3 months (around 11/25/2024) for chronic follow up.   The patient indicates understanding of these issues and agrees with the plan.  Margaret CHRISTELLA Search, FNP

## 2024-08-27 ENCOUNTER — Ambulatory Visit: Payer: Self-pay | Admitting: Obstetrics & Gynecology

## 2024-08-27 LAB — BETA HCG QUANT (REF LAB): hCG Quant: 34266 m[IU]/mL

## 2024-08-30 ENCOUNTER — Emergency Department (HOSPITAL_COMMUNITY)

## 2024-08-30 ENCOUNTER — Emergency Department (HOSPITAL_COMMUNITY)
Admission: EM | Admit: 2024-08-30 | Discharge: 2024-08-30 | Disposition: A | Discharge status: Home / Self Care | Attending: Emergency Medicine | Admitting: Emergency Medicine

## 2024-08-30 DIAGNOSIS — O039 Complete or unspecified spontaneous abortion without complication: Secondary | ICD-10-CM | POA: Diagnosis not present

## 2024-08-30 DIAGNOSIS — O034 Incomplete spontaneous abortion without complication: Secondary | ICD-10-CM | POA: Insufficient documentation

## 2024-08-30 DIAGNOSIS — J45909 Unspecified asthma, uncomplicated: Secondary | ICD-10-CM | POA: Insufficient documentation

## 2024-08-30 DIAGNOSIS — O209 Hemorrhage in early pregnancy, unspecified: Secondary | ICD-10-CM | POA: Diagnosis not present

## 2024-08-30 LAB — CBC WITH DIFFERENTIAL/PLATELET
Abs Immature Granulocytes: 0.02 K/uL (ref 0.00–0.07)
Basophils Absolute: 0.1 K/uL (ref 0.0–0.1)
Basophils Relative: 1 %
Eosinophils Absolute: 0.2 K/uL (ref 0.0–0.5)
Eosinophils Relative: 3 %
HCT: 34.7 % — ABNORMAL LOW (ref 36.0–46.0)
Hemoglobin: 10.1 g/dL — ABNORMAL LOW (ref 12.0–15.0)
Immature Granulocytes: 0 %
Lymphocytes Relative: 22 %
Lymphs Abs: 1.7 K/uL (ref 0.7–4.0)
MCH: 19.9 pg — ABNORMAL LOW (ref 26.0–34.0)
MCHC: 29.1 g/dL — ABNORMAL LOW (ref 30.0–36.0)
MCV: 68.3 fL — ABNORMAL LOW (ref 80.0–100.0)
Monocytes Absolute: 0.5 K/uL (ref 0.1–1.0)
Monocytes Relative: 6 %
Neutro Abs: 5.3 K/uL (ref 1.7–7.7)
Neutrophils Relative %: 68 %
Platelets: 315 K/uL (ref 150–400)
RBC: 5.08 MIL/uL (ref 3.87–5.11)
RDW: 17.3 % — ABNORMAL HIGH (ref 11.5–15.5)
Smear Review: NORMAL
WBC: 7.8 K/uL (ref 4.0–10.5)
nRBC: 0 % (ref 0.0–0.2)

## 2024-08-30 LAB — BASIC METABOLIC PANEL WITH GFR
Anion gap: 13 (ref 5–15)
BUN: 11 mg/dL (ref 6–20)
CO2: 24 mmol/L (ref 22–32)
Calcium: 9.3 mg/dL (ref 8.9–10.3)
Chloride: 101 mmol/L (ref 98–111)
Creatinine, Ser: 0.65 mg/dL (ref 0.44–1.00)
GFR, Estimated: >60 mL/min (ref >60)
Glucose, Bld: 125 mg/dL — ABNORMAL HIGH (ref 70–99)
Potassium: 4 mmol/L (ref 3.5–5.1)
Sodium: 137 mmol/L (ref 135–145)

## 2024-08-30 LAB — HCG, QUANTITATIVE, PREGNANCY: hCG, Beta Chain, Quant, S: 16610 m[IU]/mL — ABNORMAL HIGH (ref <5)

## 2024-08-30 MED ORDER — MISOPROSTOL 200 MCG PO TABS
800.0000 ug | ORAL_TABLET | Freq: Once | ORAL | Status: AC
  Administered 2024-08-30: 800 ug via VAGINAL
  Filled 2024-08-30: qty 4

## 2024-08-30 NOTE — ED Provider Notes (Signed)
 [place] EMERGENCY DEPARTMENT AT [facility] - Margaret Juarez Provider Note   CSN: [id number] Arrival date & time: 08/30/24  1532     Patient presents with: Vaginal Bleeding   Margaret Juarez is a 28 y.o. female.   Pt is a 28 yo female with pmhx significant for asthma and grave's disease.  She has had 2 normal pregnancies.  LMP was 10/5.  She has had abd pain and bleeding since then.  Today, around 11:00, she had a lot of pain and bleeding.  She thinks she saw some tissue.  The pain has decreased since she passed the possible tissue, but she is still bleeding.  She has an appt with obgyn on 11/19.  Quant on 11/15 was 34,266 Blood type B+       Prior to Admission medications   Medication Sig Start Date End Date Taking? Authorizing Provider  acetaminophen (TYLENOL) 325 MG tablet Take 2 tablets (650 mg total) by mouth every 6 (six) hours as needed. 08/11/24   Elnor Jayson LABOR, DO  buPROPion (WELLBUTRIN XL) 150 MG 24 hr tablet Take 1 tablet (150 mg total) by mouth daily. 07/15/24   Joesph Annabella HERO, FNP  busPIRone  (BUSPAR ) 5 MG tablet Take 1 tablet (5 mg total) by mouth 2 (two) times daily. 07/15/24   Joesph Annabella HERO, FNP  ferrous sulfate 325 (65 FE) MG tablet Take 325 mg by mouth 2 (two) times daily with a meal.    [provider]  methimazole  (TAPAZOLE ) 5 MG tablet Take 5 mg by mouth 2 (two) times daily.    [provider]    Allergies: Mangifera indica, Mango butter, and Cephalexin    Review of Systems  Gastrointestinal:  Positive for abdominal pain.  Genitourinary:  Positive for vaginal bleeding.  All other systems reviewed and are negative.   Updated Vital Signs BP (!) 135/94 (BP Location: Right Arm)   Pulse 88   Temp 98.4 F (36.9 C) (Oral)   Resp 18   Ht 5' 9 (1.753 m)   Wt 101 kg   LMP 07/18/2024 (Exact Date)   SpO2 100%   BMI 32.88 kg/m   Physical Exam Vitals and nursing note reviewed. Exam conducted with a chaperone present.   Constitutional:      Appearance: Normal appearance.  HENT:     Head: Normocephalic and atraumatic.     Right Ear: External ear normal.     Left Ear: External ear normal.     Nose: Nose normal.     Mouth/Throat:     Mouth: Mucous membranes are moist.     Pharynx: Oropharynx is clear.  Eyes:     Extraocular Movements: Extraocular movements intact.     Conjunctiva/sclera: Conjunctivae normal.     Pupils: Pupils are equal, round, and reactive to light.  Cardiovascular:     Rate and Rhythm: Normal rate and regular rhythm.     Pulses: Normal pulses.     Heart sounds: Normal heart sounds.  Pulmonary:     Effort: Pulmonary effort is normal.     Breath sounds: Normal breath sounds.  Abdominal:     General: Abdomen is flat. Bowel sounds are normal.     Palpations: Abdomen is soft.  Genitourinary:    Exam position: Lithotomy position.     Vagina: Bleeding present.     Uterus: Tender.   Musculoskeletal:        General: Normal range of motion.     Cervical back: Normal range of  motion and neck supple.  Skin:    General: Skin is warm.     Capillary Refill: Capillary refill takes less than 2 seconds.  Neurological:     General: No focal deficit present.     Mental Status: She is alert and oriented to person, place, and time.  Psychiatric:        Mood and Affect: Mood normal.        Behavior: Behavior normal.     (all labs ordered are listed, but only abnormal results are displayed) Labs Reviewed  CBC WITH DIFFERENTIAL/PLATELET - Abnormal; Notable for the following components:      Result Value   Hemoglobin 10.1 (*)    HCT 34.7 (*)    MCV 68.3 (*)    MCH 19.9 (*)    MCHC 29.1 (*)    RDW 17.3 (*)    All other components within normal limits  BASIC METABOLIC PANEL WITH GFR - Abnormal; Notable for the following components:   Glucose, Bld 125 (*)    All other components within normal limits  HCG, QUANTITATIVE, PREGNANCY - Abnormal; Notable for the following components:   hCG,  Beta Chain, Quant, S 16,610 (*)    All other components within normal limits    EKG: None  Radiology: US  OB Comp < 14 Wks Result Date: 08/30/2024 CLINICAL DATA:  One day history of heavy vaginal bleeding. Beta hCG is not available at the time of interpretation, previously 34,266 on 08/25/2024. EXAM: OBSTETRIC <14 WK US  AND TRANSVAGINAL OB US  TECHNIQUE: Both transabdominal and transvaginal ultrasound examinations were performed for complete evaluation of the gestation as well as the maternal uterus, adnexal regions, and pelvic cul-de-sac. Transvaginal technique was performed to assess early pregnancy. COMPARISON:  Obstetric ultrasound dated 08/11/2024 FINDINGS: Intrauterine gestational sac: None Yolk sac:  Not Visualized. Embryo:  Not Visualized. Subchorionic hemorrhage:  None visualized. Maternal uterus/adnexae: Thickened endometrium demonstrates heterogeneous echogenicity, measuring 17 mm. Normal right ovary. Again seen is a thick-walled cystic structure inseparable from the left ovary measuring 1.7 x 1.5 x 1.4 cm, which now appears centrally anechoic. There is claw sign of the left ovary. IMPRESSION: 1. Pregnancy of unknown location. Differential may include early intrauterine pregnancy, ectopic pregnancy, or early pregnancy loss. 2. Persistent thick-walled cystic structure inseparable from the left ovary, again favored to represent a corpus luteum. In the setting of pregnancy of unknown location, a left adnexal ectopic pregnancy can have a similar appearance, although is less likely given favored intra-ovarian location. Critical Value/emergent results were called by telephone at the time of interpretation on 08/30/2024 at 5:18 pm to provider Ozell Arts, who verbally acknowledged these results. Electronically Signed   By: Limin  Xu M.D.   On: 08/30/2024 17:20   US  OB Transvaginal Result Date: 08/30/2024 CLINICAL DATA:  One day history of heavy vaginal bleeding. Beta hCG is not available at the  time of interpretation, previously 34,266 on 08/25/2024. EXAM: OBSTETRIC <14 WK US  AND TRANSVAGINAL OB US  TECHNIQUE: Both transabdominal and transvaginal ultrasound examinations were performed for complete evaluation of the gestation as well as the maternal uterus, adnexal regions, and pelvic cul-de-sac. Transvaginal technique was performed to assess early pregnancy. COMPARISON:  Obstetric ultrasound dated 08/11/2024 FINDINGS: Intrauterine gestational sac: None Yolk sac:  Not Visualized. Embryo:  Not Visualized. Subchorionic hemorrhage:  None visualized. Maternal uterus/adnexae: Thickened endometrium demonstrates heterogeneous echogenicity, measuring 17 mm. Normal right ovary. Again seen is a thick-walled cystic structure inseparable from the left ovary measuring 1.7 x 1.5 x 1.4  cm, which now appears centrally anechoic. There is claw sign of the left ovary. IMPRESSION: 1. Pregnancy of unknown location. Differential may include early intrauterine pregnancy, ectopic pregnancy, or early pregnancy loss. 2. Persistent thick-walled cystic structure inseparable from the left ovary, again favored to represent a corpus luteum. In the setting of pregnancy of unknown location, a left adnexal ectopic pregnancy can have a similar appearance, although is less likely given favored intra-ovarian location. Critical Value/emergent results were called by telephone at the time of interpretation on 08/30/2024 at 5:18 pm to provider Ozell Arts, who verbally acknowledged these results. Electronically Signed   By: Limin  Xu M.D.   On: 08/30/2024 17:20     Procedures   Medications Ordered in the ED  misoprostol (CYTOTEC) tablet 800 mcg (has no administration in time range)                                    Medical Decision Making Amount and/or Complexity of Data Reviewed Labs: ordered.  Risk Prescription drug management.   This patient presents to the ED for concern of vaginal bleeding in pregnancy, this involves  an extensive number of treatment options, and is a complaint that carries with it a high risk of complications and morbidity.  The differential diagnosis includes ectopic, miscarriage   Co morbidities that complicate the patient evaluation  asthma and grave's disease   Additional history obtained:  Additional history obtained from epic chart review    Lab Tests:  I Ordered, and personally interpreted labs.  The pertinent results include:  cbc with hgb low at 10.1 (stable); bmp nl; quant 16610   Imaging Studies ordered:  I ordered imaging studies including US   I independently visualized and interpreted imaging which showed   Pregnancy of unknown location. Differential may include early  intrauterine pregnancy, ectopic pregnancy, or early pregnancy loss.  2. Persistent thick-walled cystic structure inseparable from the  left ovary, again favored to represent a corpus luteum. In the  setting of pregnancy of unknown location, a left adnexal ectopic  pregnancy can have a similar appearance, although is less likely  given favored intra-ovarian location.   I agree with the radiologist interpretation   Medicines ordered and prescription drug management:  I ordered medication including cytotec  for miscarriage  Reevaluation of the patient after these medicines showed that the patient stayed the same I have reviewed the patients home medicines and have made adjustments as needed   Test Considered:  us    Consultations Obtained:  I requested consultation with the obygn,  and discussed lab and imaging findings as well as pertinent plan - he recommends cytotec and keep ob appt in 2 days   Problem List / ED Course:  Miscarriage:  bleeding has slowed since she passed the tissue.  Blood type B+. Quant is lower than 2 days ago.  She is stable for d/c.  Return if worse.    Reevaluation:  After the interventions noted above, I reevaluated the patient and found that they have  :improved   Social Determinants of Health:  Lives at home   Dispostion:  After consideration of the diagnostic results and the patients response to treatment, I feel that the patent would benefit from discharge with outpatient f/u.       Final diagnoses:  Incomplete miscarriage    ED Discharge Orders     None  Dean Clarity, MD 08/30/24 2004

## 2024-08-30 NOTE — ED Triage Notes (Signed)
 Pt concerned that she is having a miscarriage. Pt has had spotting throughout pregnancy and has a schedule ultrasound in 2 days to confirm pregnancy. today started to have a lot of blood with intermittent cramping. Pt has had numerous hcg levels drawn and is approx 7-8 week pregnancy

## 2024-09-01 ENCOUNTER — Encounter: Payer: Self-pay | Admitting: Adult Health

## 2024-09-01 ENCOUNTER — Ambulatory Visit: Admitting: Adult Health

## 2024-09-01 ENCOUNTER — Other Ambulatory Visit

## 2024-09-01 VITALS — BP 128/89 | HR 86 | Ht 69.0 in | Wt 222.0 lb

## 2024-09-01 DIAGNOSIS — D509 Iron deficiency anemia, unspecified: Secondary | ICD-10-CM

## 2024-09-01 DIAGNOSIS — O039 Complete or unspecified spontaneous abortion without complication: Secondary | ICD-10-CM | POA: Diagnosis not present

## 2024-09-01 DIAGNOSIS — Z3A00 Weeks of gestation of pregnancy not specified: Secondary | ICD-10-CM | POA: Diagnosis not present

## 2024-09-01 MED ORDER — PRENATAL PLUS 27-1 MG PO TABS: 1.0000 | ORAL_TABLET | Freq: Every day | ORAL | 12 refills | Status: AC

## 2024-09-01 NOTE — Progress Notes (Signed)
  Subjective:     Patient ID: Margaret Juarez, female   DOB: 21-Mar-1996, 28 y.o.   MRN: [id number]  HPI Margaret Juarez is a 28 year old white female, single, [id number] in for follow up on being seen in ER 08/30/24 for pain and bleeding, in pregnancy, with tissue passage, QHCG was 16.610, down from 34,266 08/25/24 and US  showed:IMPRESSION: 1. Pregnancy of unknown location. Differential may include early intrauterine pregnancy, ectopic pregnancy, or early pregnancy loss. 2. Persistent thick-walled cystic structure inseparable from the left ovary, again favored to represent a corpus luteum. In the setting of pregnancy of unknown location, a left adnexal ectopic pregnancy can have a similar appearance, although is less likely given favored intra-ovarian location. HGB was 10.1, and she was thought to be having a miscarriage and was given 4 cytotec in ER and bleeding is heavy with tissue passage and clots. She had pain at right waist area since October. Last pap 09/28/21 NILM PCP is Annabella Search FNP Review of Systems +pain in right side +bleeding with clots and passage of tissue Reviewed past medical,surgical, social and family history. Reviewed medications and allergies.     Objective:   Physical Exam BP 128/89 (BP Location: Left Arm, Patient Position: Sitting, Cuff Size: Large)   Pulse 86   Ht 5' 9 (1.753 m)   Wt 222 lb (100.7 kg)   LMP 07/18/2024 (Exact Date)   BMI 32.78 kg/m     Skin warm and dry.Pelvic: external genitalia is normal in appearance no lesions, vagina: +blood, +clots,urethra has no lesions or masses noted, cervix:smooth and bulbous, uterus: normal size, shape and contour, non tender, no masses felt, adnexa: no masses or tenderness noted. Bladder is non tender and no masses felt.   Upstream - 09/01/24 1132       Pregnancy Intention Screening   Does the patient want to become pregnant in the next year? N/A    Does the patient's partner want to become pregnant in the next  year? N/A    Would the patient like to discuss contraceptive options today? N/A      Contraception Wrap Up   Current Method Pregnant/Seeking Pregnancy    End Method Pregnant/Seeking Pregnancy    Contraception Counseling Provided No         Examination chaperoned by Clarita Salt LPN  Assessment:     1. Miscarriage (Primary) Sp cytotec, +bleeding Will check QHCG and CBC Will rx PNV and is on iron Meds ordered this encounter  Medications   prenatal vitamin w/FE, FA (PRENATAL 1 + 1) 27-1 MG TABS tablet    Sig: Take 1 tablet by mouth daily at 12 noon.    Dispense:  30 tablet    Refill:  12    Supervising Provider:   JAYNE MINDER H [[id number]]    - Beta hCG quant (ref lab) - CBC    Will message when labs back She says she wants tubes removed after this   Plan:     Follow up in 2 weeks for ROS

## 2024-09-02 ENCOUNTER — Ambulatory Visit: Payer: Self-pay | Admitting: Adult Health

## 2024-09-02 LAB — CBC
Hematocrit: 35 % (ref 34.0–46.6)
Hemoglobin: 10.1 g/dL — ABNORMAL LOW (ref 11.1–15.9)
MCH: 20 pg — ABNORMAL LOW (ref 26.6–33.0)
MCHC: 28.9 g/dL — ABNORMAL LOW (ref 31.5–35.7)
MCV: 69 fL — ABNORMAL LOW (ref 79–97)
Platelets: 325 x10E3/uL (ref 150–450)
RBC: 5.04 x10E6/uL (ref 3.77–5.28)
RDW: 16.5 % — ABNORMAL HIGH (ref 11.7–15.4)
WBC: 6.2 x10E3/uL (ref 3.4–10.8)

## 2024-09-02 LAB — BETA HCG QUANT (REF LAB): hCG Quant: 4763 m[IU]/mL

## 2024-09-13 DIAGNOSIS — O039 Complete or unspecified spontaneous abortion without complication: Secondary | ICD-10-CM

## 2024-09-13 HISTORY — DX: Complete or unspecified spontaneous abortion without complication: O03.9

## 2024-09-15 ENCOUNTER — Ambulatory Visit: Admitting: Adult Health

## 2024-09-15 ENCOUNTER — Encounter: Payer: Self-pay | Admitting: Adult Health

## 2024-09-15 VITALS — BP 135/84 | HR 70 | Ht 69.0 in | Wt 222.0 lb

## 2024-09-15 DIAGNOSIS — O039 Complete or unspecified spontaneous abortion without complication: Secondary | ICD-10-CM | POA: Diagnosis not present

## 2024-09-15 DIAGNOSIS — D509 Iron deficiency anemia, unspecified: Secondary | ICD-10-CM | POA: Diagnosis not present

## 2024-09-15 DIAGNOSIS — O9903 Anemia complicating the puerperium: Secondary | ICD-10-CM | POA: Diagnosis not present

## 2024-09-15 LAB — POCT HEMOGLOBIN: Hemoglobin: 9.4 g/dL — AB (ref 11–14.6)

## 2024-09-15 NOTE — Progress Notes (Signed)
  Subjective:     Patient ID: Margaret Juarez, female   DOB: [birth year], 28 y.o.   MRN: [id number]  HPI Margaret Juarez is a 28 year old white female,single, [id number], back in follow up on miscarriage, bleeding stopped 09/01/24 has dark spotting now, and is tired.  Last pap was 09/28/21 NILM  PCP is Meri Search FNP  Review of Systems Dark spotting +tired Reviewed past medical,surgical, social and family history. Reviewed medications and allergies.     Objective:   Physical Exam BP 135/84 (BP Location: Left Arm, Patient Position: Sitting, Cuff Size: Large)   Pulse 70   Ht 5' 9 (1.753 m)   Wt 222 lb (100.7 kg)   LMP 07/18/2024 (Exact Date)   BMI 32.78 kg/m  POC HGB 9.4   Skin warm and dry. Lungs: clear to ausculation bilaterally. Cardiovascular: regular rate and rhythm.  Upstream - 09/15/24 1018       Pregnancy Intention Screening   Does the patient want to become pregnant in the next year? No    Does the patient's partner want to become pregnant in the next year? No    Would the patient like to discuss contraceptive options today? N/A      Contraception Wrap Up   Current Method Abstinence    End Method Abstinence    Contraception Counseling Provided Yes           Assessment:     1. Miscarriage (Primary) Dark spotting Check QHCG No sex for now - Beta hCG quant (ref lab)  2. Iron deficiency anemia, unspecified iron deficiency anemia type Continue PNV and iron Check CBC and iron panel She has appt with hematology in January at [facility] - CBC - Iron, TIBC and Ferritin Panel - POCT hemoglobin     Plan:     Return 10/12/24 for pap and physical

## 2024-09-16 ENCOUNTER — Ambulatory Visit: Payer: Self-pay | Admitting: Adult Health

## 2024-09-16 DIAGNOSIS — O039 Complete or unspecified spontaneous abortion without complication: Secondary | ICD-10-CM

## 2024-09-16 LAB — CBC
Hematocrit: 34.3 % (ref 34.0–46.6)
Hemoglobin: 9.6 g/dL — ABNORMAL LOW (ref 11.1–15.9)
MCH: 19.8 pg — ABNORMAL LOW (ref 26.6–33.0)
MCHC: 28 g/dL — ABNORMAL LOW (ref 31.5–35.7)
MCV: 71 fL — ABNORMAL LOW (ref 79–97)
Platelets: 342 x10E3/uL (ref 150–450)
RBC: 4.86 x10E6/uL (ref 3.77–5.28)
RDW: 15.6 % — ABNORMAL HIGH (ref 11.7–15.4)
WBC: 7.4 x10E3/uL (ref 3.4–10.8)

## 2024-09-16 LAB — IRON,TIBC AND FERRITIN PANEL
Ferritin: 5 ng/mL — ABNORMAL LOW (ref 15–150)
Iron Saturation: 4 % — CL (ref 15–55)
Iron: 17 ug/dL — ABNORMAL LOW (ref 27–159)
Total Iron Binding Capacity: 480 ug/dL — ABNORMAL HIGH (ref 250–450)
UIBC: 463 ug/dL — ABNORMAL HIGH (ref 131–425)

## 2024-09-16 LAB — BETA HCG QUANT (REF LAB): hCG Quant: 2090 m[IU]/mL

## 2024-09-21 ENCOUNTER — Other Ambulatory Visit

## 2024-09-21 DIAGNOSIS — O039 Complete or unspecified spontaneous abortion without complication: Secondary | ICD-10-CM | POA: Diagnosis not present

## 2024-09-22 ENCOUNTER — Other Ambulatory Visit: Payer: Self-pay | Admitting: Adult Health

## 2024-09-22 DIAGNOSIS — O039 Complete or unspecified spontaneous abortion without complication: Secondary | ICD-10-CM

## 2024-09-22 LAB — BETA HCG QUANT (REF LAB): hCG Quant: 1745 m[IU]/mL

## 2024-09-22 NOTE — Progress Notes (Signed)
 Recheck [facility] 12/16

## 2024-09-28 ENCOUNTER — Telehealth: Admitting: Physician Assistant

## 2024-09-28 DIAGNOSIS — J019 Acute sinusitis, unspecified: Secondary | ICD-10-CM

## 2024-09-28 MED ORDER — FLUTICASONE PROPIONATE 50 MCG/ACT NA SUSP: 2.0000 | Freq: Every day | NASAL | 1 refills | Status: AC

## 2024-09-28 MED ORDER — DOXYCYCLINE HYCLATE 100 MG PO TABS: 100.0000 mg | ORAL_TABLET | Freq: Two times a day (BID) | ORAL | 0 refills | Status: AC

## 2024-09-28 MED ORDER — METHYLPREDNISOLONE 4 MG PO TBPK: ORAL_TABLET | ORAL | 0 refills | Status: AC

## 2024-09-28 NOTE — Progress Notes (Signed)
 E-Visit for Sinus Problems  We are sorry that you are not feeling well.  Here is how we plan to help!  Based on what you have shared with me it looks like you have sinusitis.  Sinusitis is inflammation and infection in the sinus cavities of the head.  Based on your presentation I believe you most likely have Acute Bacterial Sinusitis.  This is an infection caused by bacteria and is treated with antibiotics. I have prescribed Doxycyline 100mg  one tablet twice daily with food, for 7 days, Medrol  dosepak 4mg ,  and I have also prescribed Flonase  Nasal Spray Use 2 sprays in each nostril daily for 10-14 days You may use an oral decongestant such as Mucinex D or if you have glaucoma or high blood pressure use plain Mucinex. Saline nasal spray help and can safely be used as often as needed for congestion.  If you develop worsening sinus pain, fever or notice severe headache and vision changes, or if symptoms are not better after completion of antibiotic, please schedule an appointment with a health care provider.    Sinus infections are not as easily transmitted as other respiratory infection, however we still recommend that you avoid close contact with loved ones, especially the very young and elderly.  Remember to wash your hands thoroughly throughout the day as this is the number one way to prevent the spread of infection!  Home Care: Only take medications as instructed by your medical team. Complete the entire course of an antibiotic. Do not take these medications with alcohol. A steam or ultrasonic humidifier can help congestion.  You can place a towel over your head and breathe in the steam from hot water coming from a faucet. Avoid close contacts especially the very young and the elderly. Cover your mouth when you cough or sneeze. Always remember to wash your hands.  Get Help Right Away If: You develop worsening fever or sinus pain. You develop a severe head ache or visual changes. Your  symptoms persist after you have completed your treatment plan.  Make sure you Understand these instructions. Will watch your condition. Will get help right away if you are not doing well or get worse.  Your e-visit answers were reviewed by a board certified advanced clinical practitioner to complete your personal care plan.  Depending on the condition, your plan could have included both over the counter or prescription medications.  If there is a problem please reply  once you have received a response from your provider.  Your safety is important to us .  If you have drug allergies check your prescription carefully.    You can use MyChart to ask questions about todays visit, request a non-urgent call back, or ask for a work or school excuse for 24 hours related to this e-Visit. If it has been greater than 24 hours you will need to follow up with your provider, or enter a new e-Visit to address those concerns.  You will get an e-mail in the next two days asking about your experience.  I hope that your e-visit has been valuable and will speed your recovery. Thank you for using e-visits.  I have spent 5 minutes in review of e-visit questionnaire, review and updating patient chart, medical decision making and response to patient.   Estephan Gallardo, PA-C

## 2024-10-04 DIAGNOSIS — J101 Influenza due to other identified influenza virus with other respiratory manifestations: Secondary | ICD-10-CM | POA: Diagnosis not present

## 2024-10-12 ENCOUNTER — Ambulatory Visit: Admitting: Adult Health

## 2024-10-16 ENCOUNTER — Emergency Department (HOSPITAL_COMMUNITY)

## 2024-10-16 ENCOUNTER — Other Ambulatory Visit: Payer: Self-pay

## 2024-10-16 ENCOUNTER — Encounter (HOSPITAL_COMMUNITY): Payer: Self-pay | Admitting: Emergency Medicine

## 2024-10-16 ENCOUNTER — Emergency Department (HOSPITAL_COMMUNITY)
Admission: EM | Admit: 2024-10-16 | Discharge: 2024-10-16 | Disposition: A | Discharge status: Home / Self Care | Attending: Emergency Medicine | Admitting: Emergency Medicine

## 2024-10-16 DIAGNOSIS — D649 Anemia, unspecified: Secondary | ICD-10-CM | POA: Insufficient documentation

## 2024-10-16 DIAGNOSIS — N939 Abnormal uterine and vaginal bleeding, unspecified: Secondary | ICD-10-CM | POA: Diagnosis not present

## 2024-10-16 DIAGNOSIS — R0602 Shortness of breath: Secondary | ICD-10-CM | POA: Diagnosis present

## 2024-10-16 LAB — CBC WITH DIFFERENTIAL/PLATELET
Abs Immature Granulocytes: 0.02 K/uL (ref 0.00–0.07)
Basophils Absolute: 0 K/uL (ref 0.0–0.1)
Basophils Relative: 1 %
Eosinophils Absolute: 0.1 K/uL (ref 0.0–0.5)
Eosinophils Relative: 2 %
HCT: 29.7 % — ABNORMAL LOW (ref 36.0–46.0)
Hemoglobin: 8.5 g/dL — ABNORMAL LOW (ref 12.0–15.0)
Immature Granulocytes: 0 %
Lymphocytes Relative: 22 %
Lymphs Abs: 1.7 K/uL (ref 0.7–4.0)
MCH: 19.4 pg — ABNORMAL LOW (ref 26.0–34.0)
MCHC: 28.6 g/dL — ABNORMAL LOW (ref 30.0–36.0)
MCV: 67.8 fL — ABNORMAL LOW (ref 80.0–100.0)
Monocytes Absolute: 0.6 K/uL (ref 0.1–1.0)
Monocytes Relative: 7 %
Neutro Abs: 5.4 K/uL (ref 1.7–7.7)
Neutrophils Relative %: 68 %
Platelets: 326 K/uL (ref 150–400)
RBC: 4.38 MIL/uL (ref 3.87–5.11)
RDW: 16.4 % — ABNORMAL HIGH (ref 11.5–15.5)
WBC: 7.9 K/uL (ref 4.0–10.5)
nRBC: 0 % (ref 0.0–0.2)

## 2024-10-16 LAB — BASIC METABOLIC PANEL WITH GFR
Anion gap: 8 (ref 5–15)
BUN: 15 mg/dL (ref 6–20)
CO2: 28 mmol/L (ref 22–32)
Calcium: 8.9 mg/dL (ref 8.9–10.3)
Chloride: 102 mmol/L (ref 98–111)
Creatinine, Ser: 0.64 mg/dL (ref 0.44–1.00)
GFR, Estimated: >60 mL/min
Glucose, Bld: 111 mg/dL — ABNORMAL HIGH (ref 70–99)
Potassium: 3.8 mmol/L (ref 3.5–5.1)
Sodium: 139 mmol/L (ref 135–145)

## 2024-10-16 LAB — HCG, QUANTITATIVE, PREGNANCY: hCG, Beta Chain, Quant, S: 97 m[IU]/mL — ABNORMAL HIGH

## 2024-10-16 LAB — TROPONIN T, HIGH SENSITIVITY: Troponin T High Sensitivity: <15 ng/L (ref 0–19)

## 2024-10-16 MED ORDER — MEDROXYPROGESTERONE ACETATE 10 MG PO TABS: 5.0000 mg | ORAL_TABLET | Freq: Every day | ORAL | 0 refills | Status: AC

## 2024-10-16 NOTE — ED Provider Notes (Signed)
 " [place] [facility]GENCY DEPARTMENT AT [facility] Provider Note   CSN: [id number] Arrival date & time: 10/16/24  0143     Patient presents with: Weakness and Shortness of Breath   Margaret Juarez is a 29 y.o. female.   Patient is a 29 year old female presenting with complaints of possible low blood count.  She has been feeling somewhat weak the past 2 days, then woke up in the night with tightness in her chest.  She states that this is how she felt when she needed a blood transfusion.  She had a miscarriage in November and last week started her menses for the first time since.  She reports some heavy bleeding and is concerned she may be anemic.  She denies to be she is having any abdominal pain.  She does describe some tightness in her chest.       Prior to Admission medications  Medication Sig Start Date End Date Taking? Authorizing Provider  acetaminophen  (TYLENOL ) 325 MG tablet Take 2 tablets (650 mg total) by mouth every 6 (six) hours as needed. 08/11/24   Elnor Jayson LABOR, DO  ATENOLOL  PO Take by mouth daily.    [provider]  buPROPion  (WELLBUTRIN  XL) 150 MG 24 hr tablet Take 1 tablet (150 mg total) by mouth daily. Patient not taking: Reported on 09/15/2024 07/15/24   Joesph Annabella HERO, FNP  busPIRone  (BUSPAR ) 5 MG tablet Take 1 tablet (5 mg total) by mouth 2 (two) times daily. Patient not taking: Reported on 09/15/2024 07/15/24   Joesph Annabella HERO, FNP  doxycycline  (VIBRA -TABS) 100 MG tablet Take 1 tablet (100 mg total) by mouth 2 (two) times daily. 09/28/24   Gandhi, Safal, PA-C  fluticasone  (FLONASE ) 50 MCG/ACT nasal spray Place 2 sprays into both nostrils daily. 09/28/24   Gandhi, Safal, PA-C  methimazole  (TAPAZOLE ) 5 MG tablet Take 5 mg by mouth 2 (two) times daily.    [provider]  methylPREDNISolone  (MEDROL  DOSEPAK) 4 MG TBPK tablet Take as directed 09/28/24   Lucienne Loveless, PA-C  prenatal vitamin w/FE, FA (PRENATAL 1 + 1) 27-1 MG TABS tablet Take  1 tablet by mouth daily at 12 noon. 09/01/24   Signa Delon LABOR, NP    Allergies: Mango butter and Cephalexin    Review of Systems  All other systems reviewed and are negative.   Updated Vital Signs BP 123/83 (BP Location: Left Arm)   Pulse 87   Temp 97.9 F (36.6 C) (Oral)   Resp 20   Ht 5' 9 (1.753 m)   Wt 97.5 kg   LMP 10/05/2024 (Exact Date)   SpO2 97%   BMI 31.75 kg/m   Physical Exam Vitals and nursing note reviewed.  Constitutional:      General: She is not in acute distress.    Appearance: She is well-developed. She is not diaphoretic.  HENT:     Head: Normocephalic and atraumatic.  Cardiovascular:     Rate and Rhythm: Normal rate and regular rhythm.     Heart sounds: No murmur heard.    No friction rub. No gallop.  Pulmonary:     Effort: Pulmonary effort is normal. No respiratory distress.     Breath sounds: Normal breath sounds. No wheezing.  Abdominal:     General: Bowel sounds are normal. There is no distension.     Palpations: Abdomen is soft.     Tenderness: There is no abdominal tenderness.  Musculoskeletal:        General:  Normal range of motion.     Cervical back: Normal range of motion and neck supple.  Skin:    General: Skin is warm and dry.  Neurological:     General: No focal deficit present.     Mental Status: She is alert and oriented to person, place, and time.     (all labs ordered are listed, but only abnormal results are displayed) Labs Reviewed  BASIC METABOLIC PANEL WITH GFR  CBC WITH DIFFERENTIAL/PLATELET  HCG, SERUM, QUALITATIVE  TROPONIN T, HIGH SENSITIVITY    EKG: None  Radiology: DG Chest Portable 1 View Result Date: 10/16/2024 EXAM: 1 VIEW(S) XRAY OF THE CHEST 10/16/2024 02:03:57 AM COMPARISON: 07/29/2016 CLINICAL HISTORY: SOB FINDINGS: LUNGS AND PLEURA: No focal pulmonary opacity. No pleural effusion. No pneumothorax. HEART AND MEDIASTINUM: No acute abnormality of the cardiac and mediastinal silhouettes. BONES AND  SOFT TISSUES: No acute osseous abnormality. IMPRESSION: 1. No active cardiopulmonary disease. Electronically signed by: Franky Crease MD 10/16/2024 02:05 AM EST RP Workstation: [id number]     Procedures   Medications Ordered in the ED - No data to display                                  Medical Decision Making Amount and/or Complexity of Data Reviewed Labs: ordered. Radiology: ordered.   Patient is a 29 year old female presenting with vaginal bleeding and concerns over low blood counts.  She experienced a miscarriage in November and is having her first menses since.  Patient arrives here with stable vital signs and is afebrile.  She has had no bleeding while in the ER.  Laboratory studies obtained including CBC, basic metabolic panel, troponin, and quantitative hCG.  Laboratory studies reveal hemoglobin of 8.5 and quantitative hCG of 97, but are otherwise unremarkable.  Chest x-ray shows no acute process.  At this point, patient has no further bleeding while in the ER.  Her hemoglobin is 8.5 and I see no reason for transfusion.  She also has a quantitative hCG of 97 which I suspect is residual from her prior pregnancy that resulted in a miscarriage.  I feel as though she can safely be discharged.  I will prescribe Provera  which she can take if her bleeding persists.  To return as needed for any problems.     Final diagnoses:  None    ED Discharge Orders     None          Geroldine Berg, MD 10/16/24 0403  "

## 2024-10-16 NOTE — Discharge Instructions (Addendum)
 If bleeding does not resolve in the next 24 hours, fill the prescription for Provera  you have been given this evening.  Follow-up with your GYN and return to the ER if symptoms significantly worsen or change.

## 2024-10-16 NOTE — ED Triage Notes (Signed)
 Pt states she thinks she needs to get her hemoglobin checked. States she has low iron and that the last time she felt like this, she had to get a unit of blood. Reports miscarriage at the end of November. States she started her period recently and bled for 10 days. States she felt like she has been kicked in chest and reports SOB.

## 2024-11-04 ENCOUNTER — Other Ambulatory Visit: Payer: Self-pay | Admitting: Medical Genetics

## 2024-11-04 DIAGNOSIS — Z006 Encounter for examination for normal comparison and control in clinical research program: Secondary | ICD-10-CM

## 2024-12-16 ENCOUNTER — Ambulatory Visit: Admitting: Adult Health

## 2025-01-05 ENCOUNTER — Encounter: Payer: Self-pay | Admitting: Family Medicine
